# Patient Record
Sex: Female | Born: 1935 | Race: Black or African American | Hispanic: No | Smoking: Never smoker
Health system: Southern US, Community
[De-identification: ages and names within clinical notes are randomized; demographics above are authoritative.]

## PROBLEM LIST (undated history)

## (undated) DIAGNOSIS — I495 Sick sinus syndrome: Secondary | ICD-10-CM

## (undated) DIAGNOSIS — I739 Peripheral vascular disease, unspecified: Secondary | ICD-10-CM

## (undated) DIAGNOSIS — F0390 Unspecified dementia without behavioral disturbance: Secondary | ICD-10-CM

## (undated) DIAGNOSIS — N189 Chronic kidney disease, unspecified: Secondary | ICD-10-CM

## (undated) DIAGNOSIS — I10 Essential (primary) hypertension: Secondary | ICD-10-CM

## (undated) DIAGNOSIS — D649 Anemia, unspecified: Secondary | ICD-10-CM

## (undated) HISTORY — DX: Sick sinus syndrome: I49.5

## (undated) HISTORY — DX: Essential (primary) hypertension: I10

---

## 1991-07-31 HISTORY — PX: PACEMAKER INSERTION: SHX728

## 1998-02-07 ENCOUNTER — Encounter: Admission: RE | Admit: 1998-02-07 | Discharge: 1998-02-07 | Payer: Self-pay | Admitting: Internal Medicine

## 1998-02-09 ENCOUNTER — Encounter: Admission: RE | Admit: 1998-02-09 | Discharge: 1998-02-09 | Payer: Self-pay | Admitting: Hematology and Oncology

## 1998-02-09 ENCOUNTER — Ambulatory Visit (HOSPITAL_COMMUNITY): Admission: RE | Admit: 1998-02-09 | Discharge: 1998-02-09 | Payer: Self-pay | Admitting: Internal Medicine

## 1998-08-29 ENCOUNTER — Encounter: Admission: RE | Admit: 1998-08-29 | Discharge: 1998-08-29 | Payer: Self-pay | Admitting: Internal Medicine

## 1998-11-03 ENCOUNTER — Emergency Department (HOSPITAL_COMMUNITY): Admission: EM | Admit: 1998-11-03 | Discharge: 1998-11-03 | Payer: Self-pay | Admitting: Emergency Medicine

## 1999-03-22 ENCOUNTER — Encounter: Admission: RE | Admit: 1999-03-22 | Discharge: 1999-03-22 | Payer: Self-pay | Admitting: Internal Medicine

## 1999-06-16 ENCOUNTER — Encounter: Admission: RE | Admit: 1999-06-16 | Discharge: 1999-06-16 | Payer: Self-pay | Admitting: Internal Medicine

## 1999-08-23 ENCOUNTER — Encounter: Admission: RE | Admit: 1999-08-23 | Discharge: 1999-08-23 | Payer: Self-pay | Admitting: Internal Medicine

## 2002-08-26 ENCOUNTER — Inpatient Hospital Stay (HOSPITAL_COMMUNITY): Admission: EM | Admit: 2002-08-26 | Discharge: 2002-08-29 | Payer: Self-pay | Admitting: Emergency Medicine

## 2002-08-26 ENCOUNTER — Encounter: Payer: Self-pay | Admitting: Emergency Medicine

## 2003-04-13 ENCOUNTER — Ambulatory Visit (HOSPITAL_COMMUNITY): Admission: RE | Admit: 2003-04-13 | Discharge: 2003-04-13 | Payer: Self-pay | Admitting: Internal Medicine

## 2003-05-16 ENCOUNTER — Inpatient Hospital Stay (HOSPITAL_COMMUNITY): Admission: EM | Admit: 2003-05-16 | Discharge: 2003-05-21 | Payer: Self-pay | Admitting: Emergency Medicine

## 2003-05-17 ENCOUNTER — Encounter: Payer: Self-pay | Admitting: Internal Medicine

## 2003-06-11 ENCOUNTER — Inpatient Hospital Stay (HOSPITAL_COMMUNITY): Admission: AD | Admit: 2003-06-11 | Discharge: 2003-06-15 | Payer: Self-pay | Admitting: Internal Medicine

## 2003-06-15 ENCOUNTER — Encounter (INDEPENDENT_AMBULATORY_CARE_PROVIDER_SITE_OTHER): Payer: Self-pay | Admitting: *Deleted

## 2003-08-01 ENCOUNTER — Emergency Department (HOSPITAL_COMMUNITY): Admission: EM | Admit: 2003-08-01 | Discharge: 2003-08-02 | Payer: Self-pay | Admitting: Emergency Medicine

## 2003-08-05 ENCOUNTER — Inpatient Hospital Stay (HOSPITAL_COMMUNITY): Admission: EM | Admit: 2003-08-05 | Discharge: 2003-08-18 | Payer: Self-pay | Admitting: Emergency Medicine

## 2003-08-12 ENCOUNTER — Encounter (INDEPENDENT_AMBULATORY_CARE_PROVIDER_SITE_OTHER): Payer: Self-pay | Admitting: *Deleted

## 2003-08-13 ENCOUNTER — Encounter (INDEPENDENT_AMBULATORY_CARE_PROVIDER_SITE_OTHER): Payer: Self-pay | Admitting: *Deleted

## 2003-08-17 ENCOUNTER — Encounter (INDEPENDENT_AMBULATORY_CARE_PROVIDER_SITE_OTHER): Payer: Self-pay | Admitting: *Deleted

## 2003-08-18 ENCOUNTER — Encounter (INDEPENDENT_AMBULATORY_CARE_PROVIDER_SITE_OTHER): Payer: Self-pay | Admitting: *Deleted

## 2004-02-08 ENCOUNTER — Ambulatory Visit (HOSPITAL_COMMUNITY): Admission: RE | Admit: 2004-02-08 | Discharge: 2004-02-09 | Payer: Self-pay | Admitting: Cardiology

## 2004-11-03 ENCOUNTER — Ambulatory Visit (HOSPITAL_COMMUNITY): Admission: RE | Admit: 2004-11-03 | Discharge: 2004-11-03 | Payer: Self-pay | Admitting: Internal Medicine

## 2005-06-29 ENCOUNTER — Ambulatory Visit (HOSPITAL_COMMUNITY): Admission: RE | Admit: 2005-06-29 | Discharge: 2005-06-29 | Payer: Self-pay | Admitting: Internal Medicine

## 2006-12-13 ENCOUNTER — Ambulatory Visit (HOSPITAL_COMMUNITY): Admission: RE | Admit: 2006-12-13 | Discharge: 2006-12-13 | Payer: Self-pay | Admitting: *Deleted

## 2007-05-23 ENCOUNTER — Encounter (INDEPENDENT_AMBULATORY_CARE_PROVIDER_SITE_OTHER): Payer: Self-pay | Admitting: Otolaryngology

## 2007-05-23 ENCOUNTER — Ambulatory Visit (HOSPITAL_COMMUNITY): Admission: RE | Admit: 2007-05-23 | Discharge: 2007-05-24 | Payer: Self-pay | Admitting: Otolaryngology

## 2009-05-26 ENCOUNTER — Inpatient Hospital Stay (HOSPITAL_COMMUNITY): Admission: EM | Admit: 2009-05-26 | Discharge: 2009-06-09 | Payer: Self-pay | Admitting: Emergency Medicine

## 2009-05-26 ENCOUNTER — Ambulatory Visit: Payer: Self-pay | Admitting: Internal Medicine

## 2009-05-26 ENCOUNTER — Encounter (INDEPENDENT_AMBULATORY_CARE_PROVIDER_SITE_OTHER): Payer: Self-pay | Admitting: *Deleted

## 2009-05-30 ENCOUNTER — Encounter (INDEPENDENT_AMBULATORY_CARE_PROVIDER_SITE_OTHER): Payer: Self-pay | Admitting: *Deleted

## 2009-06-02 ENCOUNTER — Ambulatory Visit: Payer: Self-pay | Admitting: Internal Medicine

## 2009-06-03 ENCOUNTER — Encounter (INDEPENDENT_AMBULATORY_CARE_PROVIDER_SITE_OTHER): Payer: Self-pay | Admitting: *Deleted

## 2009-06-03 ENCOUNTER — Ambulatory Visit: Payer: Self-pay | Admitting: Oncology

## 2009-06-09 ENCOUNTER — Encounter (INDEPENDENT_AMBULATORY_CARE_PROVIDER_SITE_OTHER): Payer: Self-pay | Admitting: *Deleted

## 2009-07-07 ENCOUNTER — Telehealth: Payer: Self-pay | Admitting: Internal Medicine

## 2009-07-07 DIAGNOSIS — I10 Essential (primary) hypertension: Secondary | ICD-10-CM | POA: Insufficient documentation

## 2009-07-07 DIAGNOSIS — I2510 Atherosclerotic heart disease of native coronary artery without angina pectoris: Secondary | ICD-10-CM | POA: Insufficient documentation

## 2009-07-07 DIAGNOSIS — K119 Disease of salivary gland, unspecified: Secondary | ICD-10-CM | POA: Insufficient documentation

## 2009-07-07 DIAGNOSIS — I739 Peripheral vascular disease, unspecified: Secondary | ICD-10-CM | POA: Insufficient documentation

## 2009-07-07 DIAGNOSIS — Z87448 Personal history of other diseases of urinary system: Secondary | ICD-10-CM | POA: Insufficient documentation

## 2009-07-07 DIAGNOSIS — R1310 Dysphagia, unspecified: Secondary | ICD-10-CM | POA: Insufficient documentation

## 2009-07-07 DIAGNOSIS — I6529 Occlusion and stenosis of unspecified carotid artery: Secondary | ICD-10-CM | POA: Insufficient documentation

## 2009-07-07 DIAGNOSIS — D539 Nutritional anemia, unspecified: Secondary | ICD-10-CM | POA: Insufficient documentation

## 2009-07-07 DIAGNOSIS — E785 Hyperlipidemia, unspecified: Secondary | ICD-10-CM | POA: Insufficient documentation

## 2009-07-07 DIAGNOSIS — F068 Other specified mental disorders due to known physiological condition: Secondary | ICD-10-CM

## 2009-07-07 DIAGNOSIS — A419 Sepsis, unspecified organism: Secondary | ICD-10-CM | POA: Insufficient documentation

## 2009-07-07 DIAGNOSIS — R6521 Severe sepsis with septic shock: Secondary | ICD-10-CM

## 2009-07-07 DIAGNOSIS — D696 Thrombocytopenia, unspecified: Secondary | ICD-10-CM | POA: Insufficient documentation

## 2009-07-13 ENCOUNTER — Ambulatory Visit: Payer: Self-pay | Admitting: Internal Medicine

## 2009-07-13 DIAGNOSIS — K5900 Constipation, unspecified: Secondary | ICD-10-CM | POA: Insufficient documentation

## 2010-05-21 ENCOUNTER — Encounter (HOSPITAL_BASED_OUTPATIENT_CLINIC_OR_DEPARTMENT_OTHER): Payer: Self-pay | Admitting: Internal Medicine

## 2010-05-30 NOTE — Consult Note (Signed)
Summary: Evaluation for Pacemaker       NAME:  [name], [name]                 ACCOUNT NO.:  [id number]      MEDICAL RECORD NO.:  [id number]          PATIENT TYPE:  INP      LOCATION:  1223                         FACILITY:  [facility]      PHYSICIAN:  Jake Bathe, MD      DATE OF BIRTH:  1915/11/19      DATE OF CONSULTATION:  05/27/2009   DATE OF DISCHARGE:                                    CONSULTATION      REASON FOR CONSULTATION:  At the request of Critical Care Medicine for   the evaluation of pacemaker.  Dr. Molli Knock referring.  Pacemaker was   placed originally by Dr. Corliss Marcus.      She is a 77 year old female who is currently critically ill with sepsis   physiology, on dopamine/Levophed, who yesterday was noted to have   inappropriate pacer spikes throughout her telemetry strips when she was   in a normal sinus rhythm.  The pacemaker did not appear to be sensing   correctly and was inappropriately trying to pace.  Of course, these   pacer spikes were not capturing secondary to the placement within the R-   R interval.  In other words, this was within the ventricular refractory.      Because of her [employer] device, I had Feliz Beam with [employer] interrogate   her pacemaker earlier this morning.  During that period of time, she was   tachycardia due to her beta-agonist administration.  He was able to tell   that her atrial sensing was quite low at 1.0 to 1.4 mV and her   ventricular sensing was 5.6 to 8.0.  Her mode was on DDDR.  There is   actually some question of an AVNRT but this is likely sinus tachycardia   currently.      Because of the atrial sensing issues, she was switched to a DDIR with   the I being inhibition with rate response.  She currently with her   tachycardia is not utilizing her pacemaker device.  She has had no   severe bradycardia.      She is currently critically ill.  I have spoken with her family member,   one of which is a Restaurant manager, fast food.  Please let me  know if I can be of any   further assistance.  Currently, she is being managed medically by the   critical care team.               Jake Bathe, MD            MCS/MEDQ  D:  05/28/2009  T:  05/28/2009  Job:  [id number]         Electronically Signed by Donato Schultz MD on 06/20/2009 04:26:50 PM

## 2010-05-30 NOTE — Discharge Summary (Signed)
Summary: Altered Level of Consciousness   NAME:  [name], [name]                           ACCOUNT NO.:  [id number]   MEDICAL RECORD NO.:  [id number]                   PATIENT TYPE:  INP   LOCATION:  4743                                 FACILITY:  [facility]   PHYSICIAN:  Rosanne Sack, M.D.         DATE OF BIRTH:  [birth year]   DATE OF ADMISSION:  06/11/2003  DATE OF DISCHARGE:  06/15/2003                                 DISCHARGE SUMMARY   CHIEF COMPLAINT:  Altered level of consciousness, sacral ulcer.   DISCHARGE DIAGNOSES:  1. Sacral decubitus ulcer, no obvious indication of active infection.     a. Leukocytosis improved with current antibiotic therapy, day five of 14.        Most likely source leukocytosis, chronic left heel osteomyelitis as        per January 2005 x-ray indicating osteomyelitis with possible        superimposed calcaneal fracture.     b. Wound care team consult regarding unstaged sacral ulcer with an 80%        necrotic slough with no odor or exudate. Unstagable currently.        Recommendation Accuzyme enzymatic debridement with Bailey gauze        overlay dressing change. Facility wound care at the nursing home        should follow this site daily and turn the patient right to left. We        also encourage utilization of pressure mattress overlay.  2. Failure to thrive with hypoalbuminemia, likely multifactorial secondary     to dementia, chronic osteomyelitis, multiple other comorbidities.     a. Nutritionalist consult obtained over the course of the        hospitalization. Recommended continuation of current diet (__________)        with increased calorie presentation per dining trays and Ensure        pudding supplement three times daily between meals and at bedtime.     b. Will need close supervision and documentation of oral intake, food and        fluids. Would follow with at least once weekly weights to monitor for        failure to thrive. would  follow-up a serum prealbumin two to three        weeks post discharge. I have encouraged the family to participate with        meal intake to maximize the patient's nutrition. Would certainly        continue to encourage their participation post discharge in the        nursing home.     c. I have added Marinol twice daily as an appetite stimulant at the time        of discharge. Megace was avoided due to history of likely TIAs and        peripheral vascular disease. Would titrate  dosage as per affect post        discharge.  3. Altered mental status thought secondary to baseline dementia and     underlying osteomyelitis.     a. History of recurrent unresponsiveness, etiology unclear, question TIA        secondary to hypertension versus unknown etiology.     b. CT scan brain May 18, 2002 noted atrophy and small vessel disease        with no acute findings. MRI/MRA not possible due to previous pacemaker        implant.     c. EEG study May 17, 2003 noted mild diffuse background slowing. This        may be primarily degenerative in nature secondary to dementia or a        variety of toxic or metabolic etiologies.     d. Carotid Doppler study May 17, 2003 noted right side 60-80%        internal carotid stenosis and left side no indication of carotid        artery stenosis. The vertebral artery flow antegrade bilaterally. This        is not thought contributory to the patient's intermittent        unresponsiveness.  4. Telemetry monitoring over the course or hospitalization, no indication of     arrhythmias.     a. Continue aspirin and Plavix prophylaxis post hospitalization.  5. Normocytic anemia, likely chronic disease with component of iron     deficiency anemia.     a. The patient was transfused one unit of packed red blood cells over the        course of hospitalization. Hemoglobin climbing from admission 8.9 to        post transfusion 10.2. At discharge hemoglobin is stable  at 10.3.  6. Hypertension.  Stable on current medications.     a. Previously questioned the presence of possible renal artery stenosis.  7. Chronic renal insufficiency with baseline BUN and creatinine of     approximately 23 and 1.4.     a. Renal function stable over the course of hospitalization status post        IV hydration BUN 8 and creatinine 1.1.     b. Would follow a basic metabolic panel one to two weeks post discharge.        Would follow hemoglobin and hematocrit one to two weeks post        discharge.  8. Bilateral heel dry gangrene right greater than left.     a. Current hospitalization wound care team consult found no indication of        dressings to these sites. Did request floating the heels off of the        bed to avoid pressure.     b. Lower extremity Doppler study May 16, 2002 noted bilateral ABI        demonstrating severe reduction in flow left side worse than right.     c. Left foot and heel x-ray May 17, 2003 notes osteomyelitis with a        possible superimposed calcaneal fracture.  9. CVTS consult January 2005, Dr. Edilia Bo, felt the patient was not a     candidate for revascularization. Felt there was no current need for     amputation, but if gangrene became wet type or the site became infected     would consider amputation.  10.  Suspected chronic osteomyelitis at the left heel site.  11.      History of coronary artery disease.     a. Status post pacemaker implant in the 1990s.     b. History of a myocardial infarction 1982.  12.      History of hyperlipidemia.  13.      History of non-cardiac chest pain thought secondary to     costochondritis April 2004.  14.      History of dyspnea with exertion.  15.      History of dehydration and hypokalemia January 2005.  16.      History of dementia with associated agitative and aggressive     behavior.  17.      No known drug allergies.  18.      Code status is full code per family request. 19.       Lovenox DVT prophylaxis over the course of this hospitalization.   DISCHARGE MEDICATIONS:  1. Trinsicon 1 tablet twice daily.  2. Geodon 80 mg twice daily.  3. Lopressor 50 mg twice daily.  4. Senokot two tablets daily at bedtime.  5. Vitamin C 500 mg twice daily.  6. Zinc sulfate 220 mg daily.  7. Vasotec 10 mg daily.  8. Clonidine 0.1 mg twice daily. Hold for systolic blood pressure less than     100.  9. Marinol 2.5 mg twice daily.  10.      Aspirin 325 mg daily, enteric-coated.  11.      Multivitamin once daily.  12.      Colace 100 mg twice daily.  13.      Plavix 75 mg once daily.  14.      Protonix 40 mg once daily.  15.      Augmentin 875 mg twice daily for an additional nine days post     discharge.  16.      Ensure pudding three times daily between meals and at bedtime.  17.      Tylenol 650 mg every four hours as needed for mild pain or fever.  18.      Vicodin 5/500 mg one tablet every four hours as needed for moderate     pain.   DISCHARGE INSTRUCTIONS:  1. Returning to [facility], primary care Dr. Baltazar Najjar.  2. Activity:  Up to chair as tolerated with full assistance.  3. Diet:  Unrestricted due to current failure to thrive.  4. Heel should be floated off the mattress to avoid pressure.  5. Repeat hemoglobin, hematocrit, and basic metabolic panel one to two weeks     post discharge.  6. Routine pulse and blood pressure checks post discharge.  7. Would closely monitor oral intake, food, and fluids. Would follow weekly     weights to monitor for failure to thrive.  8. Wound care to sacral ulcer should be Accuzyme to debride with gauze     dressing once daily. Once site is debrided may discontinue Accuzyme and     proceed with wound care per facility protocol. The patient should be     turned to left or right side every two hours. Suggest an air mattress     overlay if available to reduce pressure. I have continued Foley catheter      at the time of discharge to aid wound care healing of sacral ulcer.     Continuation of this catheter per the discretion of  the primary care     physician.   CONSULTATIONS OVER THE COURSE OF HOSPITALIZATION:  1. Wound care team.  2. Nutritionalist consult.   PROCEDURES PERFORMED:  Transfusion one unit packed red blood cells.   DISCHARGE LABS:  Metabolic panel June 15, 2003:  Sodium 141, potassium  3.9, chloride 108, CO2 27, BUN 8, creatinine 1.1. CBC June 15, 2003:  WBC 10.5, hemoglobin 10.3, hematocrit 31.2, platelets 314. A swab culture of  sacral wound produced multiple organisms, likely contaminants. Serum TSH  0.655. Blood cultures produced no growth over the course of the  hospitalization. Urine culture produced no growth over the course of the  hospitalization. Chest x-ray June 01, 2003 with stable chest with no  active disease.   HISTORY OF PRESENT ILLNESS:  An 77 year old female resident of [facility], followed there in primary care by Dr. Baltazar Najjar.  The patient has had progressive dry gangrene to the heels bilaterally,  previously evaluated by Dr. Edilia Bo, CVTS. She was found to have a sacral  ulcer by the nursing staff at the [facility]. This was  evaluated by the facility wound care nurse and she thought there was some  central necrosis and some periwound erythema. They described the patient  febrile, but she was not found so with her transport to the emergency room.  She was noted to have leukocytosis of 12.8 per nursing home labs, also a  hemoglobin of 8.9. The patient was admitted for further evaluation and  treatment. For dictated admission medications, family history, social  history, review of systems, physical exam, laboratory data, impression and  plan, please see the dictated admission history and physical daily June 01, 2003.   HOSPITAL COURSE:  1. Sacral decubitus ulcer. The patient presented with a  sacral ulcer as     above. She had no fever since her presentation in the emergency room, but     did have noted leukocytosis as above. Was empirically started on IV     antibiotic therapy with Zosyn. A wound team consult was obtained over the     course of the hospitalization. They noted no indication of exudate or     periwound changes. They recommended Accuzyme to the site to debride this     currently unstagable wound. Also pressure relief with turning left to     right. I would also utilize a pressure mattress overlay if available. The     patient should be turned left to right every two hours. Following     debridement as described above would proceed with wound care treatment     per facility protocol. It is thought that the patient's most likely     explanation for her leukocytosis is chronic left heel osteomyelitis as     noted per x-ray on May 17, 2003. With antibiotic therapy her     leukocytosis has decreased to 10.5. She is on day five of 14 antibiotic     therapy switched from Zosyn to oral Augmentin. At some point the patient     may require an amputation per previous CVTS consult, Dr. Edilia Bo, but     only if this should change to wet gangrene or demonstrate signs of active     infection. Per the wound care consult the heel sites do not currently     require a dressing, but do require floating the heels off of the bed     surface to avoid pressure. All  of her wounds will require frequent     facility monitoring at the nursing home.  2. Failure to thrive and hypoalbuminemia. This thought multifactorial due to     her baseline dementia, chronic osteomyelitis, multiple other     comorbidities. Her oral intake was quite poor at admission, but has     improved over the past 24 hours. Her albumin was low at 2.3. She did have     a nutritionalist consult recommending increased kilocalories to the meal     trays. I have discontinued any dietary restrictions such as low-fat,  low-    cholesterol, low-salt in hopes to encourage oral intake. I believe the     failure to thrive and development of sacral ulcer are more concerning     than her previous history of coronary artery disease. I added Marinol as     an appetite stimulant and would follow oral intake, food, and fluids     closely post discharge. Would titrate the dose based on the effect. Would     supplement as above. I have also discussed with the family the importance     of her improved oral intake. They participated with her meal intake over     the course of the hospitalization and I would certainly encourage their     further participation post discharge at the nursing home.  3. Altered mental status/dementia. The patient was noted lethargic on     presentation. This is likely multifactorial due to her chronic     osteomyelitis, failure to thrive, baseline dementia, and multiple     comorbidities. This was not aggressively worked up over the course of     this hospitalization and her level of consciousness quickly returned to     baseline. She did have a full workup for recurrent periods of     unresponsiveness during hospitalization May 17, 2003 through May 21, 2003 as above.  4. Normocytic anemia. The patient does have a normocytic anemia, likely a     combination chronic disease and component iron deficiency anemia. She is     on routine iron supplementation. Despite this, her hemoglobin decreased     from baseline mid 10 range to 3.9 on admission. She was transfused on     unit of packed red blood cells. Post transfusion hemoglobin climbed to     10.2 and this remained stable over the remainder of the hospitalization,     at discharge 10.3. Would follow with a repeat hemoglobin and hematocrit     one to two weeks post discharge.  5. Hypertension.  This is currently uncontrolled during the hospitalization     May 17, 2003 through May 21, 2003. Currently her blood pressures      are well-controlled on her antihypertensive medication regime. Would     follow blood pressures and pulse routinely post discharge at the nursing     home.  6. Chronic renal insufficiency.  The patient has a known history of chronic     renal insufficiency. Baseline BUN 23 and creatinine 1.4. She was     initially hydrated with IV fluids and her renal function remained stable     over the course of the hospitalization. Discharge BUN 8 and creatinine     1.1. Will follow with a basic metabolic panel one to two weeks post     discharge. Will aggressively encourage oral intake of fluids.  7. Bilateral heel dry  gangrene, left greater than right. Suspected chronic     osteomyelitis left heel. Treatment and impressions as above under #1.  8. History of coronary artery disease status post pacemaker implant. The     patient has a known history of MI in 59. She had no complaints of chest     pain over the course of this hospitalization. She remained stable with a     paced rhythm per telemetry. 9. Code status. The patient is full code per family wishes, this despite her     multiple comorbidities.   DISPOSITION:  Discharged back to [facility], primary care  doctor, Dr. Baltazar Najjar. Activity up as tolerated to chair.   CONDITION ON DISCHARGE:  Stable/improved, though frail with multiple medical  problems as above.   Discharge process greater than 30 minutes 11 o'clock a.m. through 11:45 a.m.      Everett Graff, N.P.                     Rosanne Sack, M.D.    TC/MEDQ  D:  06/15/2003  Blackburn:  06/15/2003  Job:  [id number]

## 2010-05-30 NOTE — Consult Note (Signed)
Summary: Feeding Tube  NAME:  MISSY, BAKSH                           ACCOUNT NO.:  [id number]   MEDICAL RECORD NO.:  [id number]                   PATIENT TYPE:  INP   LOCATION:  5154                                 FACILITY:  [facility]   PHYSICIAN:  Georgiana Spinner, M.D.                 DATE OF BIRTH:  17-Sep-1915   DATE OF CONSULTATION:  08/12/2003  DATE OF DISCHARGE:                                   CONSULTATION   REASON FOR CONSULTATION:  Ms. Anton is an 77 year old lady who I have been  asked to see for a feeding tube.   HISTORY OF PRESENT ILLNESS:  She was admitted for urinary tract infection  and probable sepsis August 04, 2003.  She has had anorexia and failure to  thrive.  She had a necrotic sacral ulcer with gangrene on both heels,  chronic anemia, history of peripheral vascular disease, hypertension, and  mild renal insufficiency, dementia.  She has had a pacemaker with coronary  artery disease documented by a myocardial infarction in 1982.  She has no  known drug allergies.  She was a full code patient.   She has had a recent steady decline in overall health for the last two  months prior to admission.  She had hospitalization earlier this year for  sepsis, failure to thrive, and the patient had not been eating or drinking  in the nursing home.   MEDICATIONS:  On admission, she was on aspirin, Plavix, Prilosec, zinc,  Clonidine, and other medications as listed on the history and physical.   FAMILY HISTORY:  Not considered significant.   SOCIAL HISTORY:  Not considered significant.   REVIEW OF SYMPTOMS:  Unobtainable by me as well.   PHYSICAL EXAMINATION:  VITAL SIGNS:  Temperature is 97.9, pulse 94,  respiratory rate 18 with a 95% O2 saturation on room air.  Blood pressure  105/50.  HEENT:  Unremarkable.  NECK:  Thyroid not enlarged.  No bruits in the neck.  LUNGS:  Clear.  CARDIOVASCULAR:  Regular rhythm without murmurs, rubs, or gallops.  ABDOMEN:  Soft without  masses, tenderness, or organomegaly.  Her abdomen has  quite bit of a pannus.   IMPRESSION:  The patient has not been able to eat here.  She had a  swallowing study which showed no mechanical problems to swallowing but the  patient is just not initiating the feedings.  The family wants a feeding  tube placed because she does still interact with her family and this appears  to be fairly reasonable to Dr. Mayford Knife.  I have not met with the family or  seen the patient interact but believe their opinion.  We will plan on doing  a percutaneous endoscopic gastrostomy tube placement.  We will explain to  the family the procedure, risks and benefits, rationale, and alternatives.  Hopefully, they will be willing to proceed with this  lady.                                              Georgiana Spinner, M.D.   GMO/MEDQ  D:  08/12/2003  T:  08/12/2003  Job:  [id number]

## 2010-05-30 NOTE — Consult Note (Signed)
Summary: For [employer]  For [facility]   Imported By: Lester Ruth 07/28/2009 08:54:27  _____________________________________________________________________  External Attachment:    Type:   Image     Comment:   External Document

## 2010-05-30 NOTE — Discharge Summary (Signed)
Summary: Anemia       NAME:  [name], [name]                 ACCOUNT NO.:  [id number]      MEDICAL RECORD NO.:  [id number]          PATIENT TYPE:  INP      LOCATION:  1338                         FACILITY[facility]  [facility]      PHYSICIAN:  Altha Harm, MDDATE OF BIRTH:  04-17-1916      DATE OF ADMISSION:  05/26/2009   DATE OF DISCHARGE:  06/09/2009                                  DISCHARGE SUMMARY      ADDENDUM:      DISCHARGE DISPOSITION:  Skilled nursing facility.      PRIMARY CARE PHYSICIAN:  [facility].      HISTORY:  Please refer to the discharge summary that was dictated on   June 07, 2009 for details of the discharge course.  Please note, the   patient was scheduled to be discharged.  However, her discharge was held   pending bed at the skilled nursing facility and observation of her   hemoglobin.      In terms of her hospital course, the patient remained stable.  The   patient in terms of her anemia,  was noted to have a few black tarry   stools.  Her hemoglobin decreased to a nadir of 7.9 at the day of   admission.  She was transfused 2 units of packed red blood cells and   Gastroenterology was consulted.  Dr. Juanda Chance saw her in consult and felt   that there was no need for GI intervention at this time.  However, she   felt that the patient should be observed for an additional 24-48 hours   to ensure that she did not have a drop in her hemoglobin.  The patient's   hemoglobin stayed stable at 9.5 to 9.7.  The patient has had no further   clinical changes and will be discharged to the nursing home today.      DISCHARGE MEDICATIONS:  Please add iron sulfate 325 mg p.o. b.i.d.   otherwise, please refer to discharge summary for medication list.      LABORATORY DATA:  Pertinent laboratory studies on the day of admission:   The patient has a hemoglobin of 9.4, white blood cell count of 9.3,   hematocrit 27.4, platelet count of 103, sodium 140, potassium 4.0,   chloride 110, bicarb 24, BUN 15, creatinine 1.7, calcium 7.5, phosphorus   of 3.1, magnesium of 1.6.  Her serum iron was low at 11 and ferritin   levels were high at 635.      CLINICAL CONDITION ON DISCHARGE:  Stable.      FOLLOW UP:   1. The patient to follow up with her primary care physician within 72       hours of returning to the skilled facility.   2. Follow up as noted in the discharge summary from June 07, 2009.      Total time for this discharge 1 hour.            Altha Harm, MD  MAM/MEDQ  D:  06/09/2009  T:  06/09/2009  Job:  [id number]      cc:   [facility]         Electronically Signed by Marthann Schiller MD on 06/15/2009 12:42:24 PM

## 2010-05-30 NOTE — Progress Notes (Signed)
Summary: Need P.O.A present for Office Visit  Phone Note Outgoing Call   Call placed by: Hortense Ramal CMA Duncan Dull),  July 07, 2009 1:55 PM Call placed to: [facility] Summary of Call: I have spoken to [place] at patient's skilled nursing facility to advise her that the patient must have her power of attorney present with her for her office visit which has been scheduled on 07/13/09 for hospital follow up.  She verbalizes understanding and has taken my name and number should any questions arise. Initial call taken by: Hortense Ramal CMA Duncan Dull),  July 07, 2009 1:57 PM

## 2010-05-30 NOTE — Assessment & Plan Note (Signed)
Summary: POST HOSPITAL FOLLOW-UP                DEBORAH   History of Present Illness Visit Type: Initial Visit Primary GI MD: Lina Sar MD Primary Provider: Providence Crosby, MD Chief Complaint: [facility], patient was anemic with a few black tarry stools History of Present Illness:   This is a 77 year old African American female who is status post hospitalization for an acute GI bleed presenting with melena. Her hemoglobin dropper from it's usual 9 g to 7.9 g. Because of her age, she did not have a GI evaluation due to the high risk of sedation. She was transfused 2 units of packed cells. There is a history of coronary artery disease and MI in 1982. She has a permanent transvenous pacemaker. She had a gastrostomy placed in April 2005 but currently there is no gastrostomy present and she is eating well. Her last hemoglobin was 9.3 and her serum iron was 11. Her iron binding capacity was 168 consistent with anemia of chronic disease and her ferritin was 635.   GI Review of Systems      Denies abdominal pain, acid reflux, belching, bloating, chest pain, dysphagia with liquids, dysphagia with solids, heartburn, loss of appetite, nausea, vomiting, vomiting blood, weight loss, and  weight gain.        Denies anal fissure, black tarry stools, change in bowel habit, constipation, diarrhea, diverticulosis, fecal incontinence, heme positive stool, hemorrhoids, irritable bowel syndrome, jaundice, light color stool, liver problems, rectal bleeding, and  rectal pain.    Current Medications (verified): 1)  Acetaminophen 325 Mg Tabs (Acetaminophen) .... Take 2 Tablets By Mouth Every 4 Hours As Needed 2)  Hydralazine Hcl 10 Mg Tabs (Hydralazine Hcl) .... Take 1 Tablet By Mouth Three Times A Day 3)  Metoprolol Tartrate 50 Mg Tabs (Metoprolol Tartrate) .... Take 1 Tablet By Mouth Two Times A Day 4)  Aricept 10 Mg Tabs (Donepezil Hcl) .... Take 1 Tablet By Mouth Once A Day 5)  Aspirin 81 Mg Tbec  (Aspirin) .... Take 1 Tablet By Mouth Once A Day 6)  Centrum  Tabs (Multiple Vitamins-Minerals) .... Take 1 Tablet By Mouth Once A Day 7)  Depakote 125 Mg Tbec (Divalproex Sodium) .... Take 2 Capsules By Mouth Three Times A Day 8)  Lasix 20 Mg Tabs (Furosemide) .... Take 1 Tablet By Mouth Once A Day 9)  Morphine Sulfate 15 Mg Tabs (Morphine Sulfate) .... Take 1 Tablet By Mouth Every Morning 10)  Namenda 10 Mg Tabs (Memantine Hcl) .... Take 1 Tablet By Mouth Two Times A Day 11)  Norvasc 10 Mg Tabs (Amlodipine Besylate) .... Take 1 Tablet By Mouth Once A Day 12)  Prilosec Otc 20 Mg Tbec (Omeprazole Magnesium) .... Once Daily 13)  Senokot 8.6 Mg Tabs (Sennosides) .... Take 1 Tablet By Mouth At Bedtime 14)  Seroquel 25 Mg Tabs (Quetiapine Fumarate) .... Take 1 Tab By Mouth At Bedtime 15)  Sertraline Hcl 100 Mg Tabs (Sertraline Hcl) .... Take 1 Tablet By Mouth Once A Day 16)  Sertraline Hcl 50 Mg Tabs (Sertraline Hcl) .... Take 1 Tablet By Mouth Once A Day 17)  Zocor 20 Mg Tabs (Simvastatin) .... Take 1 Tab By Mouth At Bedtime 18)  Remeron 15 Mg Tabs (Mirtazapine) .... Take 1/2 Tablet By Mouth Once Daily  Allergies (verified): No Known Drug Allergies  Past History:  Past Medical History: Reviewed history from 07/07/2009 and no changes required. Current Problems:  DYSPHAGIA UNSPECIFIED (ICD-787.20) CAROTID  ARTERY DISEASE (ICD-433.10) PERIPHERAL VASCULAR DISEASE (ICD-443.9) DYSLIPIDEMIA (ICD-272.4) CORONARY ARTERY DISEASE (ICD-414.00) RENAL FAILURE, ACUTE, HX OF (ICD-V13.09) DEMENTIA (ICD-294.8) HYPERTENSION (ICD-401.9) THROMBOCYTOPENIA (ICD-287.5) PYELONEPHRITIS, HX OF (ICD-V13.00) SEPTIC SHOCK (ICD-785.52) ANEMIA, CHRONIC (ICD-281.9)    Past Surgical History: Reviewed history from 07/07/2009 and no changes required. PEG Placement Pacemaker Placement-single chamber...later upgraded to dual chamber Right superficial parotidectomy with dissection of the facial nerve  Right double-J  stent placement for hydronephrosis  Family History: Unknown  Review of Systems       The patient complains of confusion.  The patient denies allergy/sinus, anemia, anxiety-new, arthritis/joint pain, back pain, blood in urine, breast changes/lumps, change in vision, cough, coughing up blood, depression-new, fainting, fatigue, fever, headaches-new, hearing problems, heart murmur, heart rhythm changes, itching, menstrual pain, muscle pains/cramps, night sweats, nosebleeds, pregnancy symptoms, shortness of breath, skin rash, sleeping problems, sore throat, swelling of feet/legs, swollen lymph glands, thirst - excessive , urination - excessive , urination changes/pain, urine leakage, vision changes, and voice change.         Pertinent positive and negative review of systems were noted in the above HPI. All other ROS was otherwise negative.   Vital Signs:  Patient profile:   77 year old female Height:      65 inches Weight:      151 pounds Pulse rate:   80 / minute Pulse rhythm:   regular BP sitting:   130 / 58  (left arm) Cuff size:   regular  Vitals Entered By: June McMurray CMA Duncan Dull) (July 13, 2009 11:19 AM)  Physical Exam  General:  elderly female who is hard of hearing. She is in a wheelchair. We carried her from the wheel chair to the examining table. Eyes:  PERRLA, no icterus. Mouth:  No deformity or lesions, dentition normal. Neck:  Supple; no masses or thyromegaly. Chest Wall:  permanent transvenous pacemaker in anterior left chest. Lungs:  Clear throughout to auscultation. Heart:  Regular rate and rhythm; no murmurs, rubs,  or bruits. Abdomen:  soft abdomen with normoactive bowel sounds. Minimal tenderness right lower quadrant. No bruit. No ascites. No distention. Rectal:  large amount of hard Hemoccult-negative stool. Extremities:  bilateral trace edema. Skin:  Intact without significant lesions or rashes. Psych:  hard of hearing but alert and oriented.   Impression &  Recommendations:  Problem # 1:  ANEMIA, CHRONIC (ICD-281.9) Patient has chronic anemia with Hemoccult negative stool. There is no further GI evaluation necessary at this time. She is not a good candidate for conscious sedation unless she has an acute bleed.  Problem # 2:  CONSTIPATION (ICD-564.00) Patient has functional constipation due to immobility and other medications. She has been on Senokot 2 tablets daily. We will increase her laxative regimen to add MiraLax 9 g 3 times a week.  Patient Instructions: 1)  Please begin taking Miralax 9 grams dissolved in 8 ounces water every Monday, Wednesday and Friday. 2)  Follow up as needed 3)  continue Senokot 2 p.o. q.h.s. 4)  Continue Prilosec 20 mg daily. 5)  Copy sent to : V. Chowbey, MD 6)  The medication list was reviewed and reconciled.  All changed / newly prescribed medications were explained.  A complete medication list was provided to the patient / caregiver. Prescriptions: MIRALAX  POWD (POLYETHYLENE GLYCOL 3350) Take 9 grams (1/2 scoopful) dissolved in 8 ounces water every Monday, Wednesday, and Friday  #527 grams x 7   Entered by:   Hortense Ramal CMA (AAMA)   Authorized  by:   Hart Carwin MD   Signed by:   Hortense Ramal CMA (AAMA) on 07/13/2009   Method used:   Print then Give to Patient   RxID:   [id number]

## 2010-05-30 NOTE — Consult Note (Signed)
Summary: Right Flank Pain    NAME:  [name], BALTAZAR                 ACCOUNT NO.:  [id number]      MEDICAL RECORD NO.:  [id number]          PATIENT TYPE:  INP      LOCATION:  1338                         FACILITY:  [facility]      PHYSICIAN:  Firas N. Shadad        DATE OF BIRTH:  1915-10-17      DATE OF CONSULTATION:  06/03/2009   DATE OF DISCHARGE:                                    CONSULTATION      PRIMARY PHYSICIAN:  [employer].      REQUESTING PHYSICIAN:  [facility].      HISTORY OF PRESENT ILLNESS:  [name] is a 77 year old African   American female with multiple medical problems listed below, nursing   home resident due to dementia, who was admitted on May 26, 2009,   with increased altered mental status from the baseline, and a 2-3 day   history of right flank pain, frank pyuria.  A CT of the abdomen and   pelvis was performed, showing right hydronephrosis, right hydroureter,   and a diagnosis of severe urosepsis.  Platelets on admission were   152,000, and prior labs demonstrate platelets of normal value.  On   admission, she was placed on IV antibiotics, consisting of the Cefepime   IV, with vancomycin added on May 27, 2009, along with IV fluids at   150 mL an hour.  She also received  hydrocortisone from January 28   through February 1 for acute respiratory failure.  In the interim, her   platelets began to trend down, from 152,000 on January 27, to 107,000 on   January 28, 101,000 on the following day, 74,000 on January 30 as, and   eventually 52,000, and today at 42,000 despite no heparin or low   molecular weight heparin used.  DIC panel was sent on May 27, 2009,   but those results are not yet available.  The smear shows a large   platelets, toxic granulation.  No fragments or schistocytes.  Current   antibiotics consist of Rocephin since Maxipime and Cipro as well as   vancomycin were discontinued.  No evidence of bleeding.  We were asked   to see the patient regarding these abnormal platelet counts, and to rule   out thrombocytopenia etiology.      PAST MEDICAL HISTORY:   1. Dementia.   2. History of sick sinus syndrome status post pacemaker placement.   3. Anemia of chronic disease.   4. Hypertension.   5. Dyslipidemia.   6. Peripheral vascular disease.   7. Carotid artery disease.   8. Renal insufficiency with baseline creatinine between 1.5 and 1.7.   9. Remote alcohol abuse in the 1960s and 1970s.   10.Recurrent UTIs.   11.Status post MI in 1982.      During this admission the patient has:   1. Pyelonephritis, with Proteus mirabilis culture positive, status       post right ureteral stent.   2. Septic shock.  3. Respiratory failure.   4. Dysphagia, which required GI evaluation, swallow evaluation failed       x2, possible PEG tube insertion is entertained when the       thrombocytopenia improves.      SURGERY:   1. Status post pacemaker placement, with upgrade in 2005.   2. Status post right parotid  gland mass extraction in January 2009,       Dr. Jenne Pane, negative.   3. Status post PEG tube in April 2005, with eventual extraction.      ALLERGIES:  NKDA.      MEDICATIONS:   1. Rocephin 1 gram IV q.24 hours.   2. IV fluids 50 mL an hour of normal saline.   3. Zofran 4 mg IV q.6 hours p.r.n.   4. TPN.   5. Protonix 40 mg IV.      The current medications are on hold today including Aricept, Namenda,   multivitamins, Seroquel, Senokot, Zoloft and Phenergan.      REVIEW OF SYSTEMS:  Cannot be obtained due to the mental status changes.      FAMILY HISTORY:  Noncontributory.      SOCIAL HISTORY:  The patient is widowed.  She has 9 children.  Full   code.  She quit in 1970s the use of tobacco and alcohol.  [employer].      PHYSICAL EXAMINATION:  This is a frail 77 year old African American   female, ill-appearing, lethargic.   Blood pressure 150/90, pulse 80, respirations 20, the patient is   afebrile.    HEENT:  Poor dentition.  No gum bleeding.  NG tube.  Normocephalic,   atraumatic.  PERLA.  Sclerae anicteric.  LUNGS:  Remarkable for   decreased breath sounds bilaterally but no wheezing, rhonchi or rales.   No axillary masses.  CARDIOVASCULAR:  Regular rate and rhythm with a 2/6   systolic murmur.  No rubs or gallops.   ABDOMEN:  Soft, nontender.  Bowel sounds x4.  No hepatosplenomegaly.   GU and RECTAL:  Deferred.   SKIN:  Without petechial rash or significant bruising.   BREASTS:  Not examined.   NEURO:  The patient has dementia.  Cannot follow commands, but is able   to move all her extremities.      LABORATORY DATA:  Hemoglobin 10.6, hematocrit 31.9, white count 32.4   (the patient was on hydrocortisone, discontinued on June 02, 2009),   platelet count 42,000. MCV 90.5, ANC 29.5, lymphocytes 1.6, monocytes   1.0.  Sodium 148, potassium 3.0, BUN 53, creatinine 1.79, glucose 126,   total bilirubin 0.7, alkaline phosphatase 31, AST 164, ALT 75, total   protein 5.3, albumin 1.9, calcium 7.8.  A HIT panel is pending.  MRSA   was positive PCR.  Troponin on May 27, 2009 was 2.47.      ASSESSMENT:  Dr. Clelia Croft has seen and evaluated the patient.  This is a   77 year old woman with thrombocytopenia in the setting of sepsis, acute   infection.  The differential diagnoses include:   1. Acute infarction related.   2. Antibiotics, including cefepime, which has been discontinued at       this time.   3. Heparin-induced thrombocytopenia (HIT) is less likely.   4. Disseminated intravascular coagulation (DIC) is less likely.      RECOMMENDATIONS:  Check HIT panel results.  Continue the supportive   measures.      The patient is at low risk of bleeding at this  time.  Will transfuse   platelets only if she has active bleeding or the platelet count drops to   less than or equal to 10,000.  Will follow back on Monday June 06, 2009.      Thank you very much for allowing [place] the  opportunity to participate in   the care of [name].               Marlowe Kays, P.A.         ______________________________   Blenda Nicely. ZOXWRU         SW/MEDQ  D:  06/06/2009  T:  06/06/2009  Job:  [id number]      Electronically Signed by Marlowe Kays P.A. on 06/06/2009 11:55:48 AM   Electronically Signed by Eli Hose  on 06/23/2009 09:20:31 AM   Electronically Signed by Eli Hose  on 06/23/2009 09:20:31 AM

## 2010-05-30 NOTE — Discharge Summary (Signed)
Summary: Malnutrition   NAME:  [name], GRANDERSON                           ACCOUNT NO.:  [id number]   MEDICAL RECORD NO.:  [id number]                   PATIENT TYPE:  INP   LOCATION:  5154                                 FACILITY:  [facility]   PHYSICIAN:  Lonia Blood, M.D.            DATE OF BIRTH:  [birth year]   DATE OF ADMISSION:  08/04/2003  DATE OF DISCHARGE:  08/18/2003                           DISCHARGE SUMMARY - REFERRING   DISCHARGE DIAGNOSES:  1. Pyelonephritis -     a. Infection at initial hospitalization by Citrobacter.     b. Infection at discharge from hospital with Proteus.  2. Severe protein calorie malnutrition with failure to thrive -     a. Percutaneous endoscopic gastroscopy tube initiated.     b. Cutaneous breakdown as noted below.  3. Dysphagia -     a. Severe oral phase dysphagia, thought to be secondary to dementia.     b. Percutaneous endoscopic gastroscopy tube placement per the family's        and patient's request.  4. Necrotic sacral and bilateral heel ulcers -     a. Severe protein calorie malnutrition.     b. Severe dementia.     c. Bedridden state.  5. Chronic anemia with discharge hemoglobin of 8.5.  6. Peripheral vascular disease.  7. Hypertension.  8. Chronic renal insufficiency with baseline creatinine of 1.5.  9. Dementia.  10.      Pacemaker placement.  11.      Coronary artery disease.  12.      Status post myocardial infarction in 1982.  13.      Urinary incontinence, requiring indwelling Foley catheter -     a. Foley catheter discontinued because of severe irritation of the vulva.     b. Probable overlying fungal vulvar involvement.   DISCHARGE MEDICATIONS:  1. Multivitamin, one p.o. daily.  2. Senokot two tab p.o. q.h.s.  3. Plavix 75 mg p.o. daily.  4. Protonix 40 mg p.o. daily.  5. Catapres 0.1 mg p.o. b.i.d.  6. Aspirin 81 mg p.o. daily.  7. Zoloft 25 mg p.o. daily.  8. Diflucan 100 mg p.o. daily x4 days, then stop.  9. Free  water via the PEG tube, 200 mL p.o. t.i.d.  10.      Jevity 1.2 calorie tube feed formulation at 60 mL per hour.  11.      Tylenol 650 mg via PEG tube or p.o. q.4-6h. p.r.n.   CONSULTATIONS:  1. Dr. Georgiana Spinner, gastroenterology.  2. Urinary incontinence nursing specialty team.   DIET:  The patient was able to tolerate a full liquid diet without high  concern for aspiration.  This is to be administered as needed for comfort,  in addition to the PEG tube feeds.   WOUND CARE:  Her sacral wound to be covered with a Calci-care/calcium  alginate dressing.  This is to be  covered with a 4 x 4.  This 4 x 4 is then  to be covered with OpSite to help prevent penetration of urine.  This is to  be changed daily.  The heels should be floated at all times, to prevent  further breakdown.   PROCEDURE:  1. PEG tube placement per Dr. Virginia Rochester on August 13, 2003.  2. CT scan of the abdomen and pelvis on August 17, 2003:  Normal, with no     evidence of leaking or abscess formation about the PEG tube insertion     site.  3. Speech pathology evaluation - cleared for a full liquid diet on August 11, 2003.   HISTORY OF PRESENT ILLNESS:  The patient is an 77 year old female with  multiple chronic medical problems as noted above, when she was residing in a  local nursing home.  In the days prior to her admission, she began to  develop a decreased level of consciousness.  This was further complicating  her chronic problems of severe malnutrition and severe necrotic sacral  ulceration.  A discussion was had with the family concerning the patient's  altered mental status.  They wished for her to undergo an evaluation at  [facility]. [facility], and to further address the possible need  for a PEG tube placement during her hospitalization.   HOSPITAL COURSE:  The patient was admitted initially with the diagnosis of  an altered mental status.  A workup including a urinalysis revealed a  Citrobacter  pyelonephritis.  The patient's cultures proved that this  bacteria was susceptible to ciprofloxacin.  She completed the full course of  ciprofloxacin.  She tolerated this well.  Her mental status improved back to  her baseline.  It should be noted that prior to her discharge, she began to  develop mildly elevated fever.  A workup included a repeat urinalysis which  revealed another pyelonephritis.  This time the culture was significant for  Proteus, with susceptibilities pending at the time of discharge.  The  patient had clinically improved significantly with the ciprofloxacin therapy  on an empiric basis.  The patient previously had a chronic indwelling Foley  catheter.  She is likely to be prone to ongoing and recurrent urinary tract  infections.  The Foley had to be discontinued during this hospitalization  because of severe irritation to the meatus, as well as the vulva.  There was  also evidence of overlying fungal infection.  The patient was treated for  this with Diflucan, but because of severe and extreme irritation, the Foley  had to be removed.  The patient's family requested that she be allowed to  wear diapers.  Counseling was carried out by the wound skin care nurse as  well as by this M.D., to explain to the patient's family that diapers could  exacerbate the patient's necrotic ulcers by holding urine and moisture  against them.  They voiced understanding, but did desire to continue to use  diapers.  At the time of discharge there is no evidence of active  pyelonephritis.  She is discharged on ciprofloxin 500 mg via PEG tube b.i.d.  x10 days, and stop.  Another significant issue during this hospitalization was that of nutrition.  Because of significant dementia, and because of multiple factors, to include  advanced age and recurrent urinary tract infections with altered mental  status, the patient has been losing weight.  Her p.o. intake has been very minimal.  There  are  significant concerns of dysphagia.  Speech pathology was  consulted during this hospitalization.  They evaluated the patient  initially, but their first evaluation was not deemed to be sufficient  because of the altered mental status.  When the patient returned to her  baseline mental status, they did provide further evaluation.  This included  a modified barium swallow which was carried out on August 09, 2003.  This  actually proved that there was no evidence of pharyngeal or lower  dysphagia/swallowing dysfunction.  It was appreciated that there was  significant oral phase dysphagia; however, the patient failed to initiate  swallowing.  This was felt to be secondary to the patient's severe dementia,  and not an actual functional swallowing issue.  Because of this, it was  ultimately felt that the patient would be very unreliable in regards to her  p.o. intake.  A discussion was held with the family as to the different  options.  They ultimately did desire with a PEG tube placement.  Dr. Virginia Rochester was  consulted and evaluated the patient.  She underwent a PEG tube placement  successfully, as noted above.  She had no complications from this.  In the  days after her PEG tube placement, she was complaining of some abdominal  pain.  Hemoglobin was dropping somewhat, and it was not clear whether this  was due to dilution or an actual blood loss.  As a result, a CT scan of the  abdomen was obtained.  There is no evidence of hematoma formation or  abscess.  Hemoglobin stabilized at around 8.5.  It was felt that her  hemoglobin drop was more reflective of volume expansion and dilutional  effect with increase of free water flushes to her PEG tube.  The PEG tube  inspection was clean without immediate signs of complications.  The patient  was tolerating her tube feeds at the recommended rate from out nutritional  consultation.  The patient was cleared for discharge.  The patient was admitted to the  hospital with known severe peripheral  vascular disease and malnutrition.  Inherent to these problems were chronic  ulcers of the sacrum as well as both heels.  A wound care specialty nurse  was consulted during this hospitalization.  The recommendations were those  made this specialty nurse.  These were followed during this hospitalization.  There was no significant improvement, but certainly no further decline in  the wounds during this patient's hospitalization.  It is not anticipated  that  the patient's wounds are likely to heal, because of her advanced age  and multiple complicating factors, to include severe protein calorie  malnutrition, urinary incontinence and bedridden status.  It was previously known prior to this admission that this patient suffered  with a 1.0 cm lesion appreciable on CT scan.  This was re-evaluated at the  time of her presentation to the emergency room.  This was stable in size in  comparison to her previous CT scan in December 2004.  These findings were most consistent with a benign meningioma.  This was explained to the family  and no further evaluation was necessary.  The patient has multiple other medical problems, to include hypertension,  coronary artery disease and dementia.  Fortunately these were all stable  during this hospitalization.   DISPOSITION:  By August 18, 2003, the patient was cleared for discharge home.  Her vital signs were stable.  She was tolerating her tube feeds without  difficulty. Her hemoglobin  was stable at 8.5.  A urine culture revealed  100,000 Proteus, but susceptibilities remained pending.  The patient was  without complaints.  The patient is being returned to her nursing home of  choice, to continue the PEG tube feeds there.   FOLLOW-UP CARE:  Will be provided by the attending physician at the  patient's nursing home of choice.  The patient's urine culture revealed  greater than 100,000 Proteus just prior to her discharge.   This should be  followed up on, to assure that Cipro is a reasonable antibiotic choice.  Otherwise the basic issues would be to attend to her sacral and heel wounds  religiously, and to reassess her volume and nutritional needs, to determine  if her current free water flushes and nutritional supplement are adequate.                                                Lonia Blood, M.D.    JTM/MEDQ  D:  08/18/2003  T:  08/18/2003  Job:  [id number]

## 2010-05-30 NOTE — Consult Note (Signed)
Summary: Hydronephrosis and Infection       NAME:  [name], [name]                 ACCOUNT NO.:  [id number]      MEDICAL RECORD NO.:  [id number]          PATIENT TYPE:  INP      LOCATION:  1223                         FACILITY:  [facility]      PHYSICIAN:  Mark C. Vernie Ammons, M.D.  DATE OF BIRTH:  1915/05/28      DATE OF CONSULTATION:  05/26/2009   DATE OF DISCHARGE:                                    CONSULTATION      HISTORY:  This patient is a 77 year old female who I was asked to see in   consultation regarding right hydronephrosis and infection.  She is a   resident at a nursing home where she has been complaining for the past   week of right-sided flank pain.  The patient has dementia and is unable   to provide any meaningful history.  Her history was obtained from her   power of attorney who indicated that she has been complaining of pain on   the right side for about a week.  There was no documented fever while at   the nursing home.  She appeared to have decrease in her mental status   earlier in the day and was brought to the emergency room for further   evaluation.  There she was found to have a systolic blood pressure in   the lower 90s and a Foley catheter was placed with frankly purulent   urine returning.  The CT scan was obtained which revealed mild to   moderate right hydronephrosis with hydroureter and I was contacted   regarding the patient.  She is reported to have had UTIs in the past but   only one or two a year.  She has no prior history of kidney stones or   stress or urinary incontinence.      PAST MEDICAL HISTORY:   1. Hypertension.   2. Dementia.   3. Coronary artery disease.   4. Chronic anemia.   5. Dyslipidemia.   6. Peripheral vascular disease.   7. Carotid artery disease.   8. Failure to thrive.   9. History of UTIs.   10.Renal insufficiency.   11.Transvenous pacemaker.   12.History of myocardial infarction in 1982.      SURGICAL HISTORY:   1. 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   2. Upgrade of permanent pacemaker in November 2005.   3. Percutaneous gastrostomy tube placement in April 2005.      MEDICATIONS:  At the nursing home:  Seroquel, amlodipine, Plavix,   Protonix, Lasix, sertraline, Aricept, aspirin, vitamin, Zocor, Senokot,   Depakote, Namenda, Lopressor, morphine.      ALLERGIES:  NKDA.      SOCIAL HISTORY:  The patient resides in a nursing home and does not   smoke or drink there.      FAMILY HISTORY:  Noncontributory.      REVIEW OF SYSTEMS:  Difficult to obtain, but a 13-point review of   systems was attempted  with the patient and assisted by her power of   attorney and her review of systems is positive for dementia, but   negative for incontinence.  It is positive for right flank pain and   otherwise as noted above.      PHYSICAL EXAMINATION:  GENERAL:  The patient appears to be in minimal   distress while lying flat on her bed.   HEENT:  Atraumatic/normocephalic.  She has poor dentition with loss of   multiple teeth.  Oropharynx is otherwise clear.   NECK:  Supple with no JVD with midline trachea.   CHEST:  Normal respiratory effort.   CARDIOVASCULAR:  Grade 2/6 systolic murmur heard in the aortic region.   ABDOMEN:  Soft, nontender and without mass.  She does have positive   right CVAT.   SKIN:  Warm and dry.   EXTREMITIES:  Trace edema and decreased mobility of her right lower   extremity.   NEUROLOGICALLY:  She is demented but able to move all four extremities.      LABORATORY RESULTS:  White count is 14.7, hemoglobin 13.2, hematocrit   39.1, platelets 152,000.  Her urinalysis showed blood and was nitrite   and leukocyte esterase positive with too numerous to count white and red   cells.  Her creatinine was elevated at 3.1.      CT scan reveals a mildly atrophic right kidney with hydronephrosis.  The   hydronephrosis is mild and extends down the ureter to near the  bladder.   No stone is seen within the ureter.  There are no masses seen within the   kidney.  There was perinephric stranding present.  The bladder has a   Foley catheter indwelling.      IMPRESSION:   1. Right pyelonephritis, possibly pyonephrosis with hydronephrosis.   2. Possible sepsis.   3. Acute renal failure on top of chronic renal insufficiency.   4. Hypotension.   5. Clinically she appears to have an obvious urinary tract infection.      PLAN:   1. She will be admitted to the hospital to the step-down unit for       observation but prior to going there I have discussed with her       power of attorney the need to make sure that her right system,       because of the presence of hydronephrosis, is not obstructed.  It       is possible that the ureter could be dilated but not obstructed,       however, due to her advanced age and the significant pyuria seen I       felt the risk was too great to not place a stent to assure adequate       drainage of the kidney.  I went over the procedure as well as its       risks and complications with her power of the attorney who has       elected to proceed with this procedure.  He does understand that if       I am unable to place a stent from below she will likely need to       have a percutaneous nephrostomy tube placed.   2. Her urine culture has been sent as were blood cultures.               Mark C. Vernie Ammons, M.D.         MCO/MEDQ  D:  05/27/2009  T:  05/27/2009  Job:  [id number]      cc:   [facility]         Electronically Signed by Ihor Gully M.D. on 05/29/2009 12:13:00 PM

## 2010-05-30 NOTE — Op Note (Signed)
Summary: PEG Placement   NAME:  [name], [name]                           ACCOUNT NO.:  [id number]   MEDICAL RECORD NO.:  [id number]                   PATIENT TYPE:  INP   LOCATION:  5154                                 FACILITY:  [facility]   PHYSICIAN:  Georgiana Spinner, M.D.                 DATE OF BIRTH:  1915-09-02   DATE OF PROCEDURE:  08/13/2003  DATE OF DISCHARGE:                                 OPERATIVE REPORT   PROCEDURE:  Percutaneous endoscopic gastrostomy tube placement.   ANESTHESIA:  Demerol 30 mg, Versed 3 mg.   INDICATIONS:  Inability to take adequate nourishment.   PROCEDURE:  With the patient mildly sedated in the recumbent position, the  Olympus videoscopic colonoscope was inserted in the mouth, passed under  direct vision into the esophagus, which appeared normal, into the stomach.  The fundus, body, antrum, duodenal bulb, and second portion of the duodenum  were visualized, photographs were taken, and all appeared normal.  From this  point the endoscope was withdrawn taking circumferential views of the  duodenal mucosa until the endoscope had been placed back into the stomach,  placed in retroflexion to view the stomach from below, and a mild hiatal  hernia was seen and photographed.  The endoscope was then straightened and  pulled back from the distal to proximal stomach taking circumferential views  of the gastric mucosa and allowing transillumination to occur on the  abdominal wall. Once the transillumination was seen to occur in the left  upper quadrant and palpation of the abdomen with a finger could be seen to  indent the stomach endoscopically, we then used that spot as our position  for the PEG placement.  The skin was then subsequently prepped and draped in  the usual sterile fashion.  The Xylocaine was injected into the skin to make  a wheal, and in this area I made a short horizontal incision.  Through this  was advanced the trocar and was seen to enter  directly into the stomach  endoscopically.  Through the trocar was passed a guidewire, which was  grasped with the snare through the endoscope and pulled out through the  mouth.  To this was attached the [employer] [place] pull PEG tube, which  was pulled rather easily and placed in position 4 cm removed from the bumper  and while the skin was being dressed in the usual sterile fashion, the  endoscope was reinserted into the mouth, passed into the esophagus and back  into the stomach, and the bumper was seen to be in the correct position in  the stomach.  This was photographed.  The endoscope was withdrawn.  The  patient's vital signs and pulse oximetry remained stable.  The patient  tolerated the procedure well without apparent complication.   FINDINGS:  Unremarkable endoscopic examination with percutaneous endoscopic  gastrostomy tube placement.   PLAN:  Begin using PEG tube.                                               Georgiana Spinner, M.D.    GMO/MEDQ  D:  08/13/2003  T:  08/16/2003  Job:  [id number]

## 2010-07-16 LAB — BASIC METABOLIC PANEL
BUN: 59 mg/dL — ABNORMAL HIGH (ref 6–23)
Chloride: 109 mEq/L (ref 96–112)
GFR calc Af Amer: 18 mL/min — ABNORMAL LOW (ref >60)
GFR calc non Af Amer: 15 mL/min — ABNORMAL LOW (ref >60)
Potassium: 4.1 mEq/L (ref 3.5–5.1)

## 2010-07-16 LAB — URINALYSIS, ROUTINE W REFLEX MICROSCOPIC
Bilirubin Urine: NEGATIVE
Glucose, UA: NEGATIVE mg/dL
Ketones, ur: NEGATIVE mg/dL
Specific Gravity, Urine: 1.015 (ref 1.005–1.030)
pH: 6.5 (ref 5.0–8.0)

## 2010-07-16 LAB — CBC
HCT: 25.7 % — ABNORMAL LOW (ref 36.0–46.0)
HCT: 29.4 % — ABNORMAL LOW (ref 36.0–46.0)
HCT: 32.4 % — ABNORMAL LOW (ref 36.0–46.0)
Hemoglobin: 10.5 g/dL — ABNORMAL LOW (ref 12.0–15.0)
Hemoglobin: 8.2 g/dL — ABNORMAL LOW (ref 12.0–15.0)
MCHC: 31.8 g/dL (ref 30.0–36.0)
MCHC: 32.3 g/dL (ref 30.0–36.0)
MCV: 89.6 fL (ref 78.0–100.0)
MCV: 90.8 fL (ref 78.0–100.0)
MCV: 93.1 fL (ref 78.0–100.0)
Platelets: 107 10*3/uL — ABNORMAL LOW (ref 150–400)
Platelets: 152 10*3/uL (ref 150–400)
Platelets: 74 10*3/uL — ABNORMAL LOW (ref 150–400)
RBC: 2.76 MIL/uL — ABNORMAL LOW (ref 3.87–5.11)
RBC: 3.28 MIL/uL — ABNORMAL LOW (ref 3.87–5.11)
RBC: 3.57 MIL/uL — ABNORMAL LOW (ref 3.87–5.11)
RDW: 13.4 % (ref 11.5–15.5)
RDW: 14.5 % (ref 11.5–15.5)
WBC: 14.7 10*3/uL — ABNORMAL HIGH (ref 4.0–10.5)
WBC: 17.8 10*3/uL — ABNORMAL HIGH (ref 4.0–10.5)
WBC: 26.6 10*3/uL — ABNORMAL HIGH (ref 4.0–10.5)
WBC: 35.4 10*3/uL — ABNORMAL HIGH (ref 4.0–10.5)

## 2010-07-16 LAB — DIFFERENTIAL
Basophils Absolute: 0 10*3/uL (ref 0.0–0.1)
Basophils Absolute: 0.1 10*3/uL (ref 0.0–0.1)
Basophils Relative: 0 % (ref 0–1)
Eosinophils Absolute: 0 10*3/uL (ref 0.0–0.7)
Eosinophils Relative: 0 % (ref 0–5)
Lymphocytes Relative: 2 % — ABNORMAL LOW (ref 12–46)
Lymphocytes Relative: 5 % — ABNORMAL LOW (ref 12–46)
Lymphs Abs: 0.4 10*3/uL — ABNORMAL LOW (ref 0.7–4.0)
Lymphs Abs: 0.7 10*3/uL (ref 0.7–4.0)
Monocytes Absolute: 0.2 10*3/uL (ref 0.1–1.0)
Monocytes Relative: 1 % — ABNORMAL LOW (ref 3–12)
Neutro Abs: 13.9 10*3/uL — ABNORMAL HIGH (ref 1.7–7.7)
Neutro Abs: 17.2 10*3/uL — ABNORMAL HIGH (ref 1.7–7.7)
Neutrophils Relative %: 95 % — ABNORMAL HIGH (ref 43–77)
Neutrophils Relative %: 97 % — ABNORMAL HIGH (ref 43–77)

## 2010-07-16 LAB — POCT I-STAT, CHEM 8
BUN: 44 mg/dL — ABNORMAL HIGH (ref 6–23)
HCT: 43 % (ref 36.0–46.0)
TCO2: 17 mmol/L (ref 0–100)

## 2010-07-16 LAB — COMPREHENSIVE METABOLIC PANEL
ALT: 19 U/L (ref 0–35)
ALT: 29 U/L (ref 0–35)
AST: 42 U/L — ABNORMAL HIGH (ref 0–37)
AST: 70 U/L — ABNORMAL HIGH (ref 0–37)
Albumin: 1.6 g/dL — ABNORMAL LOW (ref 3.5–5.2)
Alkaline Phosphatase: 17 U/L — ABNORMAL LOW (ref 39–117)
Alkaline Phosphatase: 22 U/L — ABNORMAL LOW (ref 39–117)
BUN: 42 mg/dL — ABNORMAL HIGH (ref 6–23)
BUN: 49 mg/dL — ABNORMAL HIGH (ref 6–23)
CO2: 16 mEq/L — ABNORMAL LOW (ref 19–32)
CO2: 17 mEq/L — ABNORMAL LOW (ref 19–32)
CO2: 26 mEq/L (ref 19–32)
Calcium: 6.6 mg/dL — ABNORMAL LOW (ref 8.4–10.5)
Calcium: 6.7 mg/dL — ABNORMAL LOW (ref 8.4–10.5)
Calcium: 6.8 mg/dL — ABNORMAL LOW (ref 8.4–10.5)
Chloride: 104 mEq/L (ref 96–112)
Chloride: 113 mEq/L — ABNORMAL HIGH (ref 96–112)
Creatinine, Ser: 3.14 mg/dL — ABNORMAL HIGH (ref 0.4–1.2)
Creatinine, Ser: 3.38 mg/dL — ABNORMAL HIGH (ref 0.4–1.2)
GFR calc Af Amer: 15 mL/min — ABNORMAL LOW (ref >60)
GFR calc Af Amer: 16 mL/min — ABNORMAL LOW (ref >60)
GFR calc non Af Amer: 13 mL/min — ABNORMAL LOW (ref >60)
GFR calc non Af Amer: 14 mL/min — ABNORMAL LOW (ref >60)
Glucose, Bld: 118 mg/dL — ABNORMAL HIGH (ref 70–99)
Glucose, Bld: 98 mg/dL (ref 70–99)
Potassium: 3.8 mEq/L (ref 3.5–5.1)
Sodium: 138 mEq/L (ref 135–145)
Total Bilirubin: 0.7 mg/dL (ref 0.3–1.2)
Total Bilirubin: 0.7 mg/dL (ref 0.3–1.2)
Total Protein: 4.2 g/dL — ABNORMAL LOW (ref 6.0–8.3)

## 2010-07-16 LAB — URINE MICROSCOPIC-ADD ON

## 2010-07-16 LAB — CROSSMATCH: ABO/RH(D): O POS

## 2010-07-16 LAB — MAGNESIUM
Magnesium: 1.8 mg/dL (ref 1.5–2.5)
Magnesium: 2 mg/dL (ref 1.5–2.5)

## 2010-07-16 LAB — URINE CULTURE

## 2010-07-16 LAB — VALPROIC ACID LEVEL: Valproic Acid Lvl: <10 ug/mL — ABNORMAL LOW (ref 50.0–100.0)

## 2010-07-16 LAB — POCT CARDIAC MARKERS: Myoglobin, poc: >500 ng/mL (ref 12–200)

## 2010-07-16 LAB — ABO/RH: ABO/RH(D): O POS

## 2010-07-16 LAB — LACTIC ACID, PLASMA
Lactic Acid, Venous: 6 mmol/L — ABNORMAL HIGH (ref 0.5–2.2)
Lactic Acid, Venous: 6.1 mmol/L — ABNORMAL HIGH (ref 0.5–2.2)

## 2010-07-19 LAB — GLUCOSE, CAPILLARY
Glucose-Capillary: 109 mg/dL — ABNORMAL HIGH (ref 70–99)
Glucose-Capillary: 113 mg/dL — ABNORMAL HIGH (ref 70–99)
Glucose-Capillary: 116 mg/dL — ABNORMAL HIGH (ref 70–99)
Glucose-Capillary: 119 mg/dL — ABNORMAL HIGH (ref 70–99)
Glucose-Capillary: 127 mg/dL — ABNORMAL HIGH (ref 70–99)
Glucose-Capillary: 127 mg/dL — ABNORMAL HIGH (ref 70–99)
Glucose-Capillary: 129 mg/dL — ABNORMAL HIGH (ref 70–99)
Glucose-Capillary: 132 mg/dL — ABNORMAL HIGH (ref 70–99)
Glucose-Capillary: 143 mg/dL — ABNORMAL HIGH (ref 70–99)
Glucose-Capillary: 162 mg/dL — ABNORMAL HIGH (ref 70–99)
Glucose-Capillary: 76 mg/dL (ref 70–99)
Glucose-Capillary: 79 mg/dL (ref 70–99)
Glucose-Capillary: 79 mg/dL (ref 70–99)
Glucose-Capillary: 80 mg/dL (ref 70–99)
Glucose-Capillary: 84 mg/dL (ref 70–99)
Glucose-Capillary: 92 mg/dL (ref 70–99)

## 2010-07-19 LAB — DIFFERENTIAL
Basophils Absolute: 0 10*3/uL (ref 0.0–0.1)
Basophils Absolute: 0 10*3/uL (ref 0.0–0.1)
Basophils Relative: 0 % (ref 0–1)
Basophils Relative: 0 % (ref 0–1)
Basophils Relative: 0 % (ref 0–1)
Basophils Relative: 0 % (ref 0–1)
Eosinophils Absolute: 0 10*3/uL (ref 0.0–0.7)
Eosinophils Absolute: 0.4 10*3/uL (ref 0.0–0.7)
Eosinophils Absolute: 0.5 10*3/uL (ref 0.0–0.7)
Eosinophils Absolute: 0.6 10*3/uL (ref 0.0–0.7)
Eosinophils Relative: 0 % (ref 0–5)
Eosinophils Relative: 0 % (ref 0–5)
Eosinophils Relative: 1 % (ref 0–5)
Eosinophils Relative: 3 % (ref 0–5)
Eosinophils Relative: 3 % (ref 0–5)
Lymphocytes Relative: 3 % — ABNORMAL LOW (ref 12–46)
Lymphocytes Relative: 4 % — ABNORMAL LOW (ref 12–46)
Lymphocytes Relative: 8 % — ABNORMAL LOW (ref 12–46)
Lymphs Abs: 1.3 10*3/uL (ref 0.7–4.0)
Lymphs Abs: 1.6 10*3/uL (ref 0.7–4.0)
Lymphs Abs: 1.6 10*3/uL (ref 0.7–4.0)
Lymphs Abs: 1.7 10*3/uL (ref 0.7–4.0)
Lymphs Abs: 1.7 10*3/uL (ref 0.7–4.0)
Lymphs Abs: 2.3 10*3/uL (ref 0.7–4.0)
Monocytes Absolute: 0.9 10*3/uL (ref 0.1–1.0)
Monocytes Absolute: 1 10*3/uL (ref 0.1–1.0)
Monocytes Relative: 3 % (ref 3–12)
Monocytes Relative: 4 % (ref 3–12)
Neutro Abs: 17.5 10*3/uL — ABNORMAL HIGH (ref 1.7–7.7)
Neutro Abs: 38.7 10*3/uL — ABNORMAL HIGH (ref 1.7–7.7)
Neutro Abs: 40.6 10*3/uL — ABNORMAL HIGH (ref 1.7–7.7)
Neutrophils Relative %: 85 % — ABNORMAL HIGH (ref 43–77)
WBC Morphology: INCREASED

## 2010-07-19 LAB — BASIC METABOLIC PANEL
BUN: 42 mg/dL — ABNORMAL HIGH (ref 6–23)
BUN: 53 mg/dL — ABNORMAL HIGH (ref 6–23)
BUN: 69 mg/dL — ABNORMAL HIGH (ref 6–23)
CO2: 26 mEq/L (ref 19–32)
CO2: 27 mEq/L (ref 19–32)
CO2: 29 mEq/L (ref 19–32)
Calcium: 7.8 mg/dL — ABNORMAL LOW (ref 8.4–10.5)
Chloride: 113 mEq/L — ABNORMAL HIGH (ref 96–112)
Chloride: 114 mEq/L — ABNORMAL HIGH (ref 96–112)
Chloride: 115 mEq/L — ABNORMAL HIGH (ref 96–112)
Creatinine, Ser: 1.59 mg/dL — ABNORMAL HIGH (ref 0.4–1.2)
Creatinine, Ser: 1.78 mg/dL — ABNORMAL HIGH (ref 0.4–1.2)
GFR calc Af Amer: 22 mL/min — ABNORMAL LOW (ref >60)
GFR calc Af Amer: 32 mL/min — ABNORMAL LOW (ref >60)
GFR calc Af Amer: 37 mL/min — ABNORMAL LOW (ref >60)
GFR calc non Af Amer: 18 mL/min — ABNORMAL LOW (ref >60)
GFR calc non Af Amer: 23 mL/min — ABNORMAL LOW (ref >60)
GFR calc non Af Amer: 30 mL/min — ABNORMAL LOW (ref >60)
Glucose, Bld: 110 mg/dL — ABNORMAL HIGH (ref 70–99)
Glucose, Bld: 120 mg/dL — ABNORMAL HIGH (ref 70–99)
Glucose, Bld: 126 mg/dL — ABNORMAL HIGH (ref 70–99)
Glucose, Bld: 97 mg/dL (ref 70–99)
Potassium: 3 mEq/L — ABNORMAL LOW (ref 3.5–5.1)
Potassium: 3.1 mEq/L — ABNORMAL LOW (ref 3.5–5.1)
Potassium: 3.6 mEq/L (ref 3.5–5.1)
Potassium: 4.1 mEq/L (ref 3.5–5.1)
Sodium: 147 mEq/L — ABNORMAL HIGH (ref 135–145)
Sodium: 149 mEq/L — ABNORMAL HIGH (ref 135–145)

## 2010-07-19 LAB — COMPREHENSIVE METABOLIC PANEL
ALT: 31 U/L (ref 0–35)
ALT: 44 U/L — ABNORMAL HIGH (ref 0–35)
AST: 32 U/L (ref 0–37)
Albumin: 2 g/dL — ABNORMAL LOW (ref 3.5–5.2)
BUN: 15 mg/dL (ref 6–23)
BUN: 38 mg/dL — ABNORMAL HIGH (ref 6–23)
CO2: 26 mEq/L (ref 19–32)
CO2: 26 mEq/L (ref 19–32)
Calcium: 7.5 mg/dL — ABNORMAL LOW (ref 8.4–10.5)
Calcium: 7.6 mg/dL — ABNORMAL LOW (ref 8.4–10.5)
Calcium: 7.7 mg/dL — ABNORMAL LOW (ref 8.4–10.5)
Creatinine, Ser: 1.17 mg/dL (ref 0.4–1.2)
Creatinine, Ser: 1.55 mg/dL — ABNORMAL HIGH (ref 0.4–1.2)
Creatinine, Ser: 1.68 mg/dL — ABNORMAL HIGH (ref 0.4–1.2)
GFR calc Af Amer: 34 mL/min — ABNORMAL LOW (ref >60)
GFR calc non Af Amer: 28 mL/min — ABNORMAL LOW (ref >60)
GFR calc non Af Amer: 31 mL/min — ABNORMAL LOW (ref >60)
GFR calc non Af Amer: 43 mL/min — ABNORMAL LOW (ref >60)
Glucose, Bld: 107 mg/dL — ABNORMAL HIGH (ref 70–99)
Glucose, Bld: 110 mg/dL — ABNORMAL HIGH (ref 70–99)
Sodium: 140 mEq/L (ref 135–145)
Sodium: 141 mEq/L (ref 135–145)
Sodium: 144 mEq/L (ref 135–145)
Total Protein: 4.8 g/dL — ABNORMAL LOW (ref 6.0–8.3)
Total Protein: 5.8 g/dL — ABNORMAL LOW (ref 6.0–8.3)

## 2010-07-19 LAB — CBC
HCT: 23.4 % — ABNORMAL LOW (ref 36.0–46.0)
HCT: 26 % — ABNORMAL LOW (ref 36.0–46.0)
HCT: 27.4 % — ABNORMAL LOW (ref 36.0–46.0)
HCT: 31.5 % — ABNORMAL LOW (ref 36.0–46.0)
HCT: 31.6 % — ABNORMAL LOW (ref 36.0–46.0)
HCT: 31.9 % — ABNORMAL LOW (ref 36.0–46.0)
Hemoglobin: 10.5 g/dL — ABNORMAL LOW (ref 12.0–15.0)
Hemoglobin: 10.6 g/dL — ABNORMAL LOW (ref 12.0–15.0)
Hemoglobin: 11.3 g/dL — ABNORMAL LOW (ref 12.0–15.0)
Hemoglobin: 8.7 g/dL — ABNORMAL LOW (ref 12.0–15.0)
Hemoglobin: 9.4 g/dL — ABNORMAL LOW (ref 12.0–15.0)
MCHC: 33.3 g/dL (ref 30.0–36.0)
MCHC: 33.3 g/dL (ref 30.0–36.0)
MCHC: 33.6 g/dL (ref 30.0–36.0)
MCHC: 33.7 g/dL (ref 30.0–36.0)
MCHC: 33.8 g/dL (ref 30.0–36.0)
MCHC: 34.2 g/dL (ref 30.0–36.0)
MCV: 89.6 fL (ref 78.0–100.0)
MCV: 89.7 fL (ref 78.0–100.0)
MCV: 89.8 fL (ref 78.0–100.0)
MCV: 90.5 fL (ref 78.0–100.0)
MCV: 90.7 fL (ref 78.0–100.0)
MCV: 91 fL (ref 78.0–100.0)
MCV: 91.2 fL (ref 78.0–100.0)
MCV: 91.3 fL (ref 78.0–100.0)
Platelets: 34 10*3/uL — ABNORMAL LOW (ref 150–400)
Platelets: 42 10*3/uL — ABNORMAL LOW (ref 150–400)
Platelets: 59 10*3/uL — ABNORMAL LOW (ref 150–400)
Platelets: 77 10*3/uL — ABNORMAL LOW (ref 150–400)
RBC: 2.86 MIL/uL — ABNORMAL LOW (ref 3.87–5.11)
RBC: 3.05 MIL/uL — ABNORMAL LOW (ref 3.87–5.11)
RBC: 3.15 MIL/uL — ABNORMAL LOW (ref 3.87–5.11)
RBC: 3.49 MIL/uL — ABNORMAL LOW (ref 3.87–5.11)
RBC: 3.71 MIL/uL — ABNORMAL LOW (ref 3.87–5.11)
RDW: 15.4 % (ref 11.5–15.5)
RDW: 15.4 % (ref 11.5–15.5)
RDW: 15.8 % — ABNORMAL HIGH (ref 11.5–15.5)
RDW: 15.8 % — ABNORMAL HIGH (ref 11.5–15.5)
RDW: 15.8 % — ABNORMAL HIGH (ref 11.5–15.5)
RDW: 16.1 % — ABNORMAL HIGH (ref 11.5–15.5)
WBC: 13.5 10*3/uL — ABNORMAL HIGH (ref 4.0–10.5)

## 2010-07-19 LAB — TRIGLYCERIDES: Triglycerides: 86 mg/dL (ref <150)

## 2010-07-19 LAB — HEMOCCULT GUIAC POC 1CARD (OFFICE): Fecal Occult Bld: POSITIVE

## 2010-07-19 LAB — CULTURE, BLOOD (ROUTINE X 2)

## 2010-07-19 LAB — IRON AND TIBC
Saturation Ratios: 7 % — ABNORMAL LOW (ref 20–55)
TIBC: 168 ug/dL — ABNORMAL LOW (ref 250–470)

## 2010-07-19 LAB — CROSSMATCH: ABO/RH(D): O POS

## 2010-07-19 LAB — DIC (DISSEMINATED INTRAVASCULAR COAGULATION)PANEL
D-Dimer, Quant: 10.48 ug/mL-FEU — ABNORMAL HIGH (ref 0.00–0.48)
Fibrinogen: 273 mg/dL (ref 204–475)
Platelets: 35 10*3/uL — ABNORMAL LOW (ref 150–400)
Prothrombin Time: 16.5 seconds — ABNORMAL HIGH (ref 11.6–15.2)

## 2010-07-19 LAB — MAGNESIUM
Magnesium: 1.6 mg/dL (ref 1.5–2.5)
Magnesium: 1.6 mg/dL (ref 1.5–2.5)
Magnesium: 1.6 mg/dL (ref 1.5–2.5)

## 2010-07-19 LAB — PREALBUMIN: Prealbumin: 17 mg/dL — ABNORMAL LOW (ref 18.0–45.0)

## 2010-07-19 LAB — PHOSPHORUS: Phosphorus: 3.1 mg/dL (ref 2.3–4.6)

## 2010-07-19 LAB — HEPARIN INDUCED THROMBOCYTOPENIA PNL: Heparin Induced Plt Ab: NEGATIVE

## 2010-07-19 LAB — FERRITIN: Ferritin: 635 ng/mL — ABNORMAL HIGH (ref 10–291)

## 2010-07-19 LAB — CHOLESTEROL, TOTAL: Cholesterol: 125 mg/dL (ref 0–200)

## 2010-08-17 IMAGING — CR DG CHEST 1V PORT
1 series · 1 of 1 positions shown · non-contrast
Comparison: 05/28/2009

CLINICAL DATA: Septic shock.  Evaluate PICC line placement.

PORTABLE CHEST - 1 VIEW

[view not recorded]
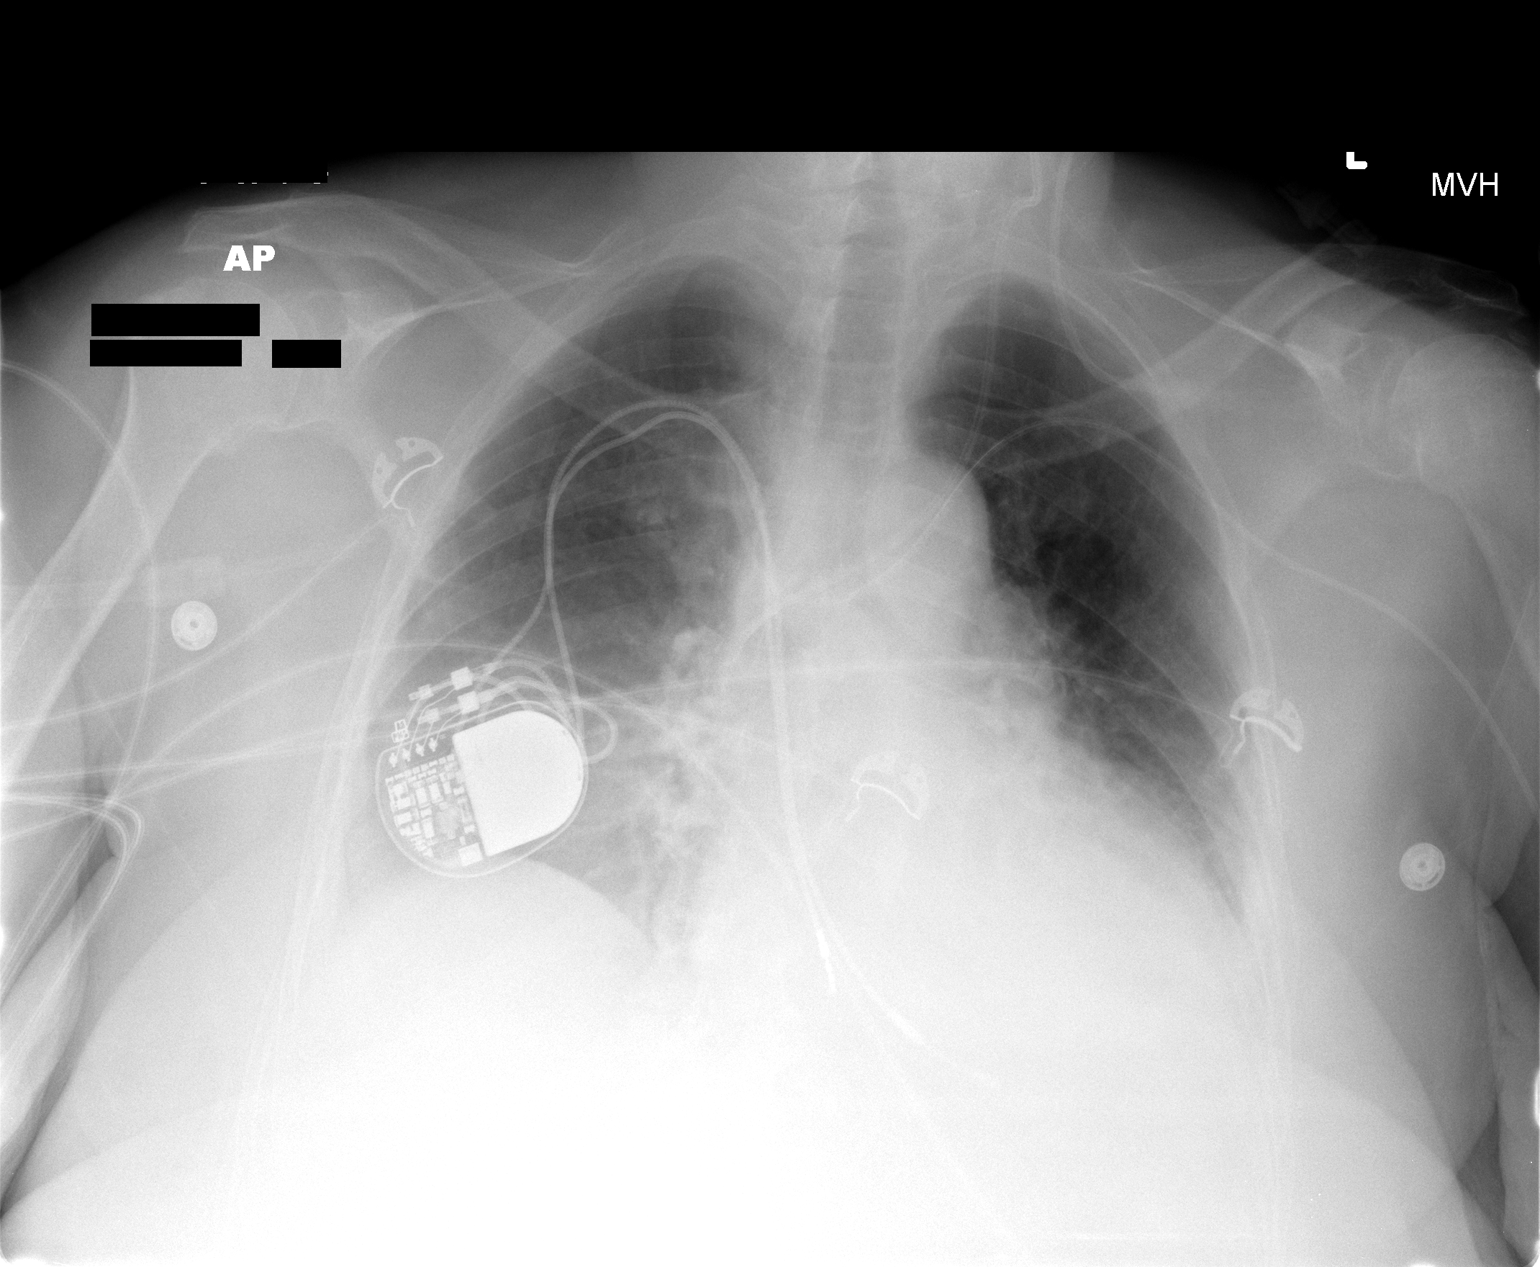

[1 of 1 positions shown; findings below may reference images not displayed]

FINDINGS: Portable view of the chest demonstrates hazy lung
densities suggestive for mild edema.  Cardiac silhouette is stable.
There is a right-sided cardiac pacemaker.  The patient has a left
PICC line that crosses the midline but extends cephalad into the
right innominate vein region.  The catheter is pointing up.  Left
jugular central line is in the upper SVC region.  Persistent
opacification at the left lung base is suggestive for atelectasis
and probably pleural fluid.
IMPRESSION: Left PICC line is pointing up in the right innominate vein area.
This is not an ideal placement.

Persistent left lung base opacification.

## 2010-08-21 IMAGING — CR DG CHEST 1V PORT
1 series · 1 of 1 positions shown · non-contrast
Comparison: 05/29/2009

CLINICAL DATA: Leukocytosis, septic shock

PORTABLE CHEST - 1 VIEW

[view not recorded]
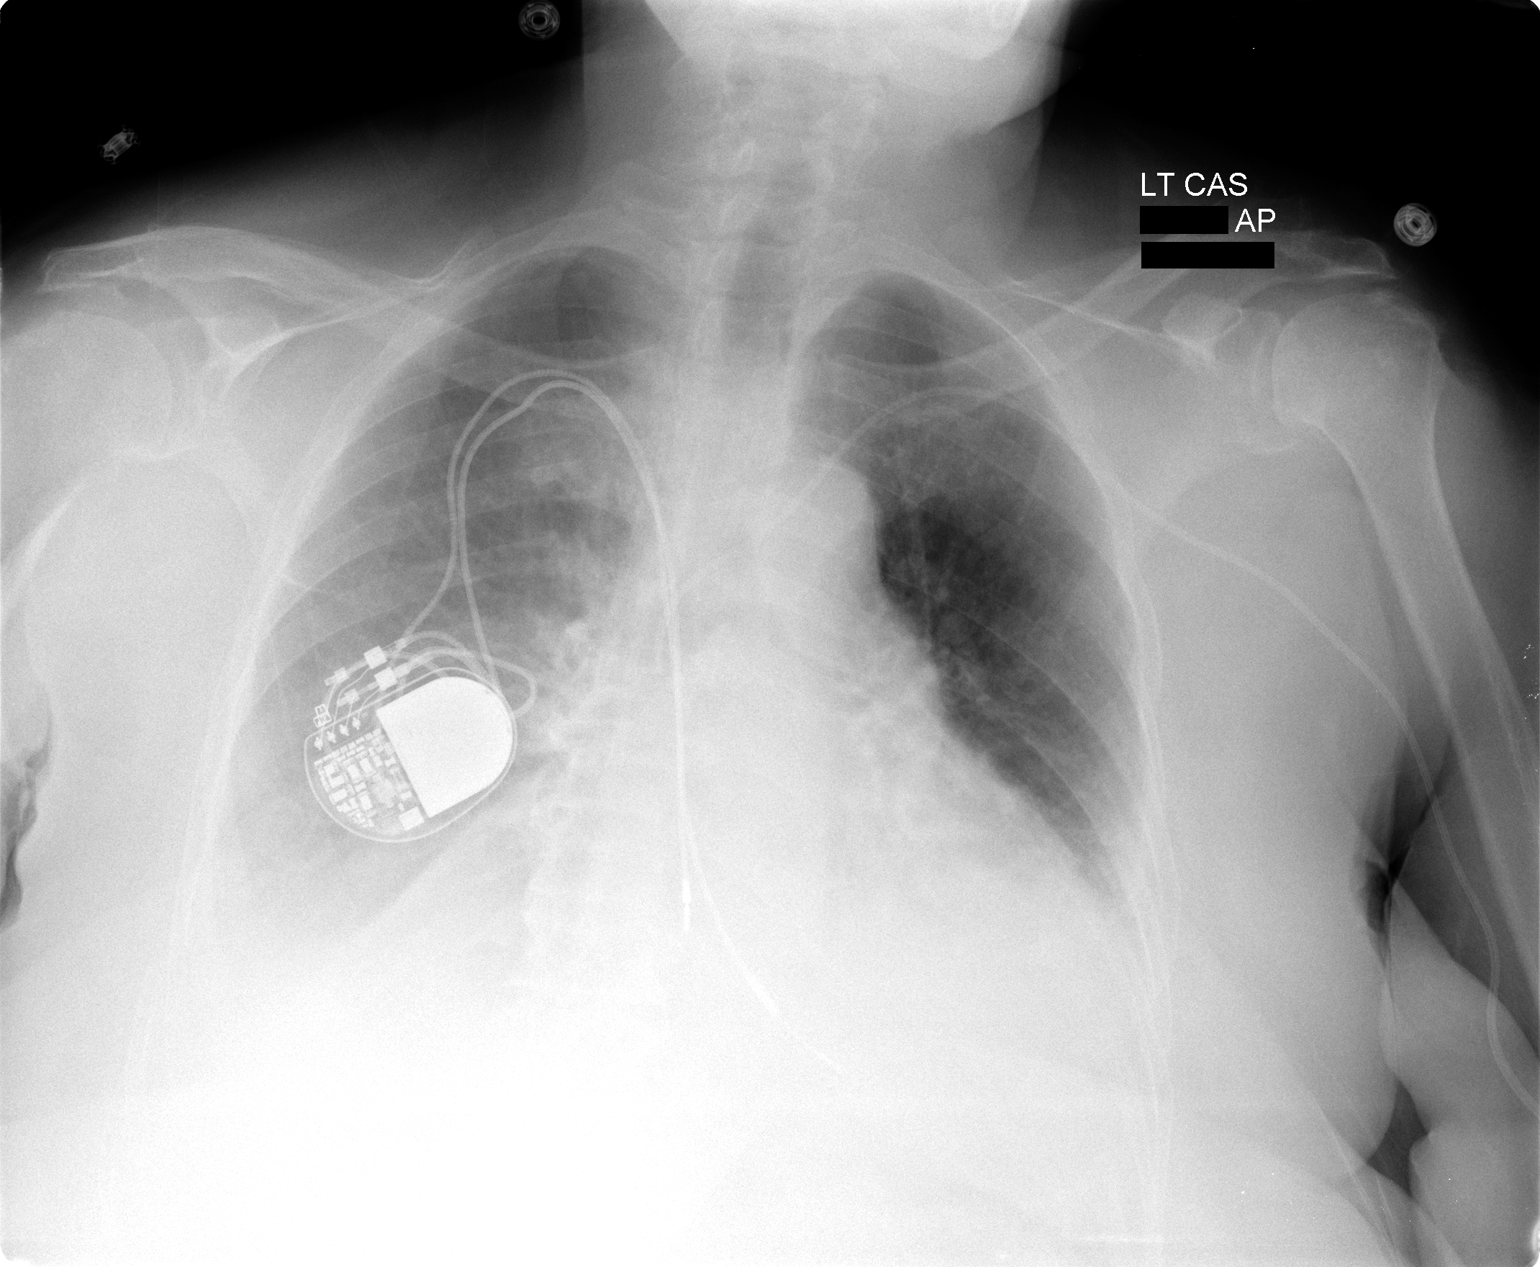

[1 of 1 positions shown; findings below may reference images not displayed]

FINDINGS: Left arm PICC and right subclavian pacemaker are stable
in position.  Heart size upper limits normal.  Probable layering
pleural effusions.  There is associated atelectasis or
consolidation in the lung bases.  Mild central pulmonary vascular
congestion is suspected.
IMPRESSION: 1.  Probable layering effusions with bibasilar
atelectasis/consolidation.
2. Support hardware stable in position.

## 2010-08-21 IMAGING — US US RENAL
1 series · 14 of 25 positions shown · non-contrast
Comparison: CT abdomen and pelvis 05/26/2009.

CLINICAL DATA: Septic shock.  Right-sided pyelonephritis.

RENAL/URINARY TRACT ULTRASOUND COMPLETE

[Series 1: us renal · 0.23mm/px · 14 of 28 slices shown]
[im 1/28]
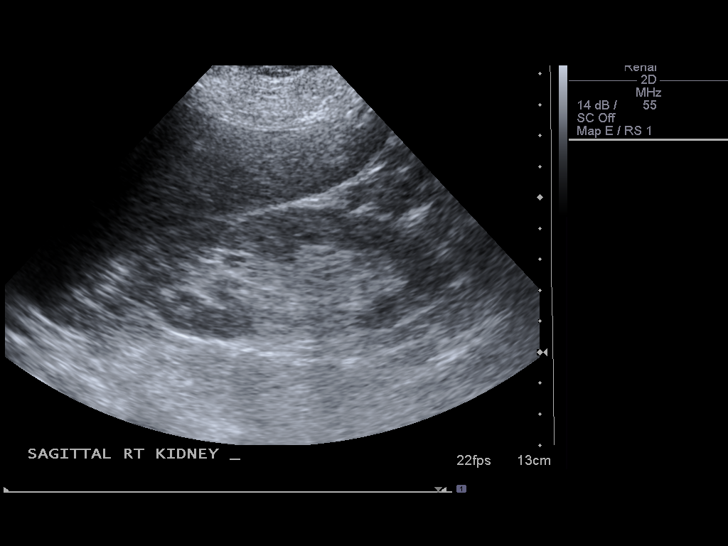
[im 3/28]
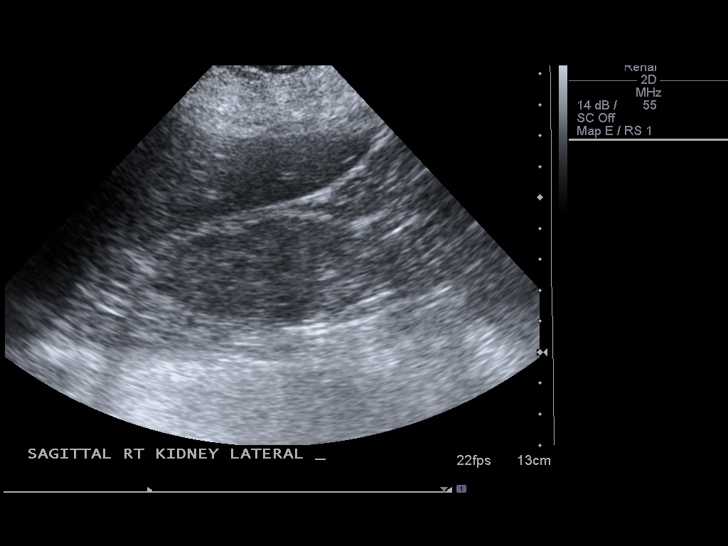
[im 5/28]
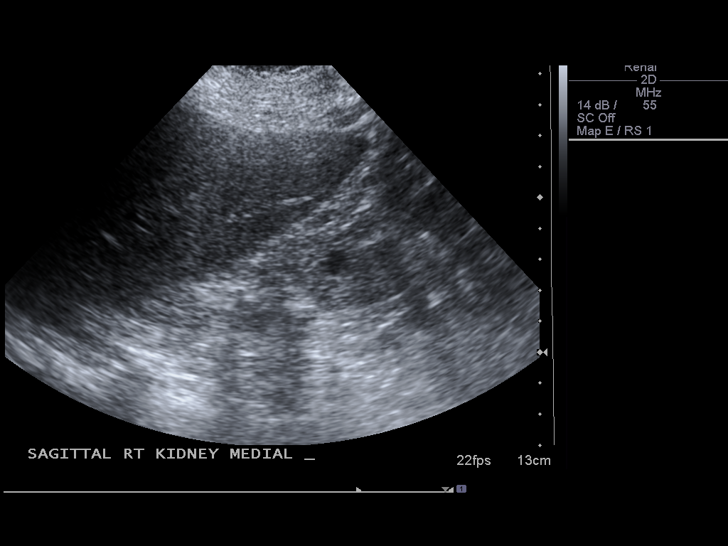
[im 7/28]
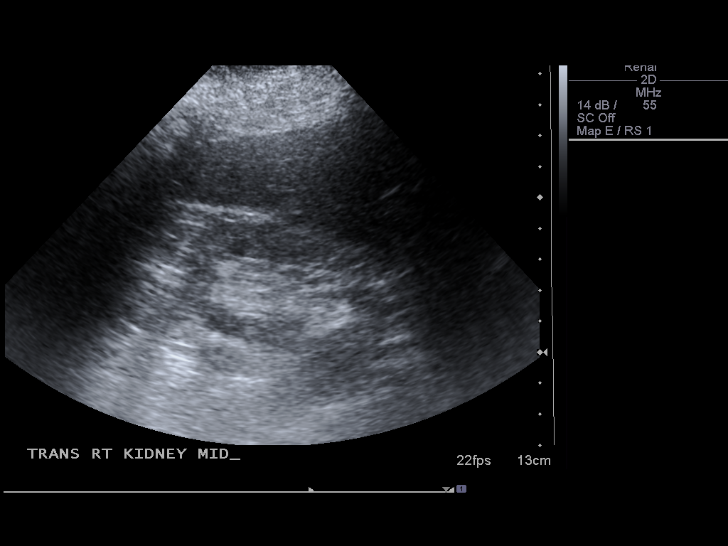
[im 10/28]
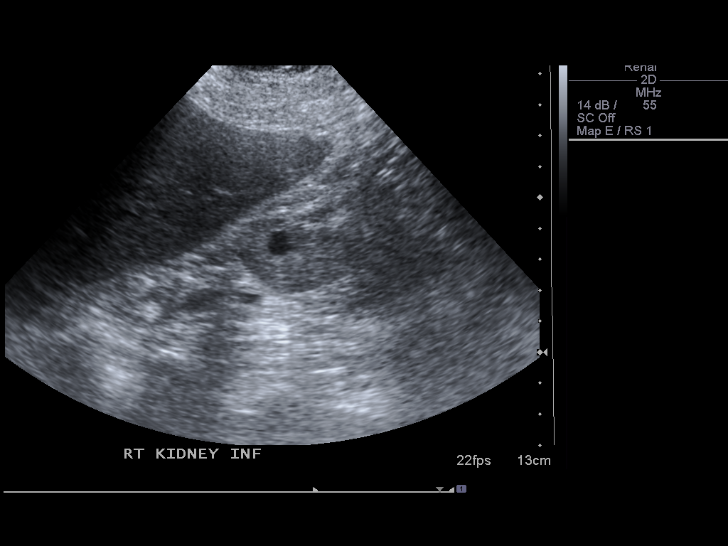
[im 11/28]
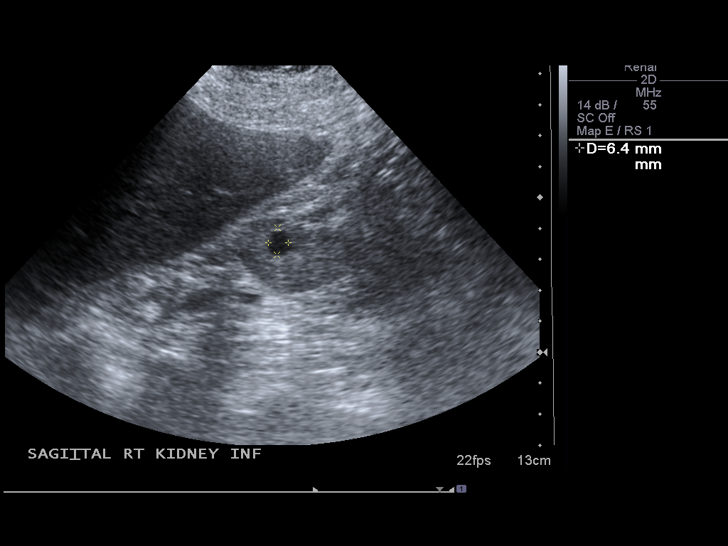
[im 13/28]
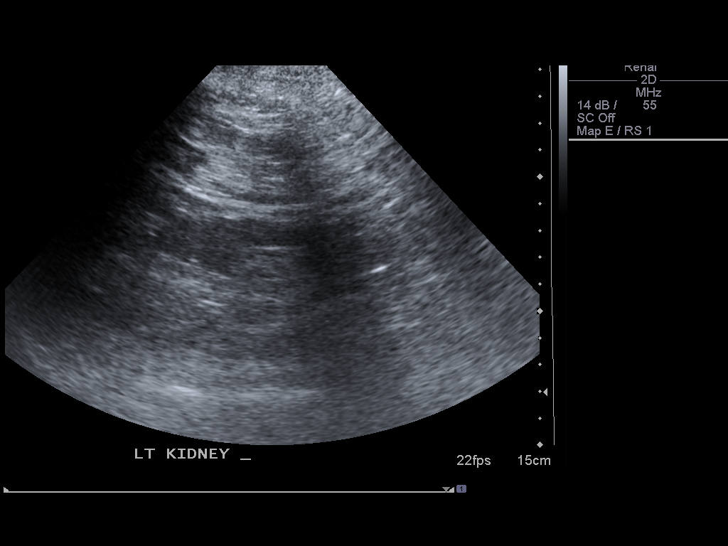
[im 15/28]
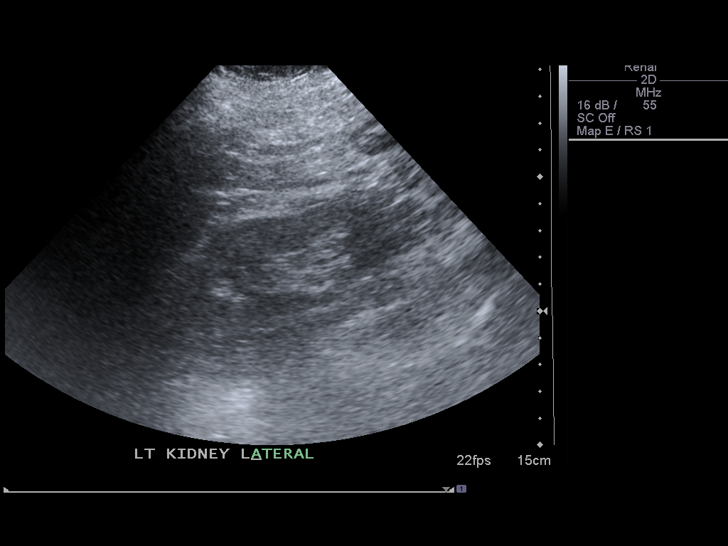
[im 17/28]
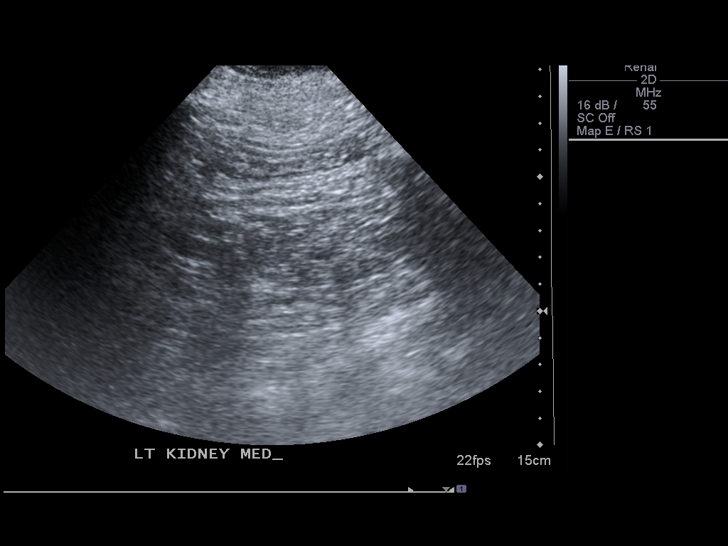
[im 19/28]
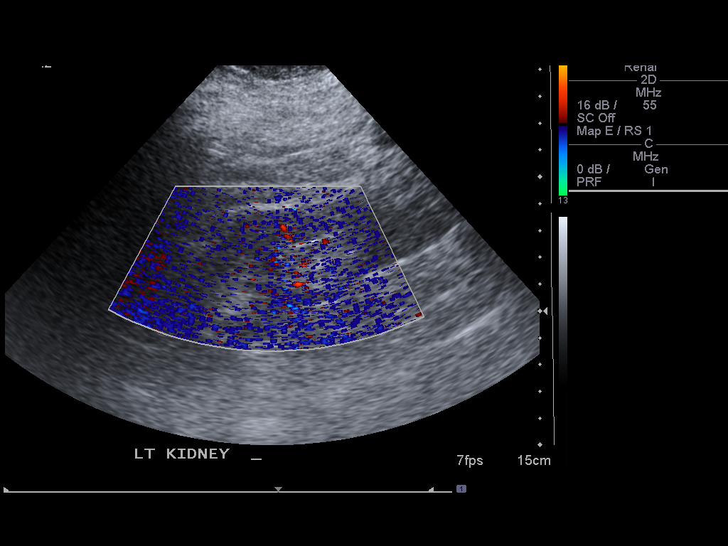
[im 21/28]
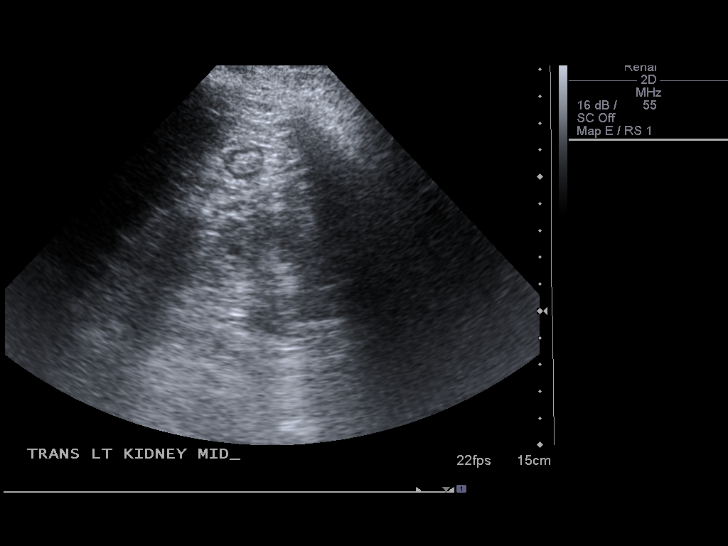
[im 23/28]
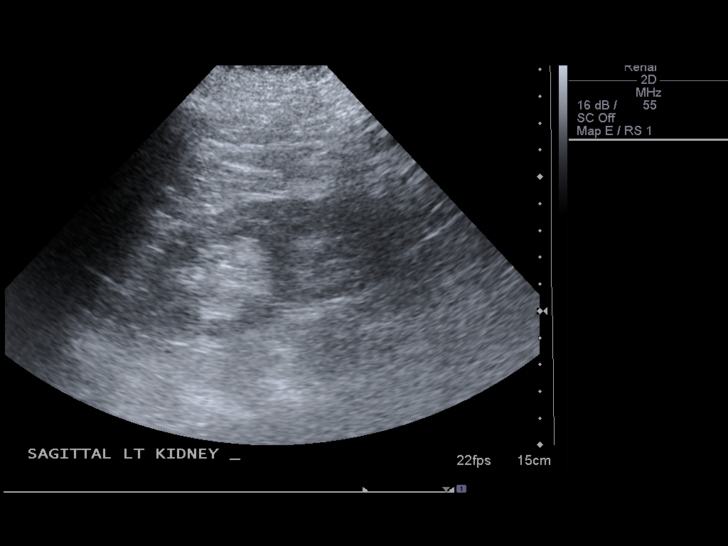
[im 25/28]
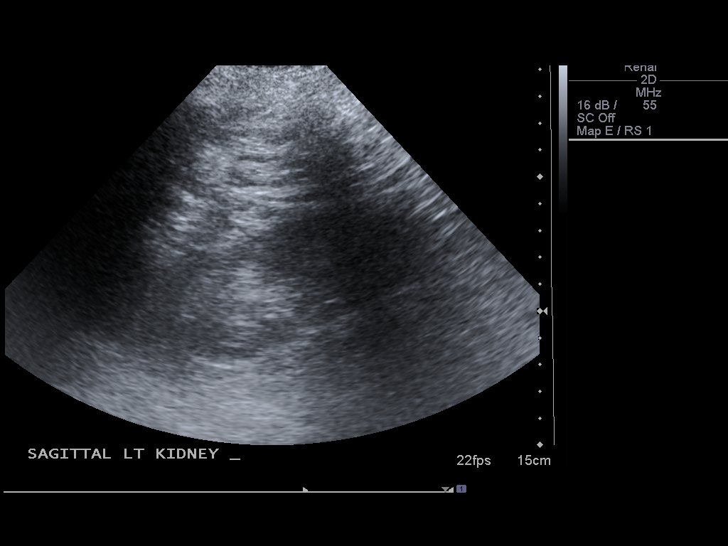
[im 28/28]
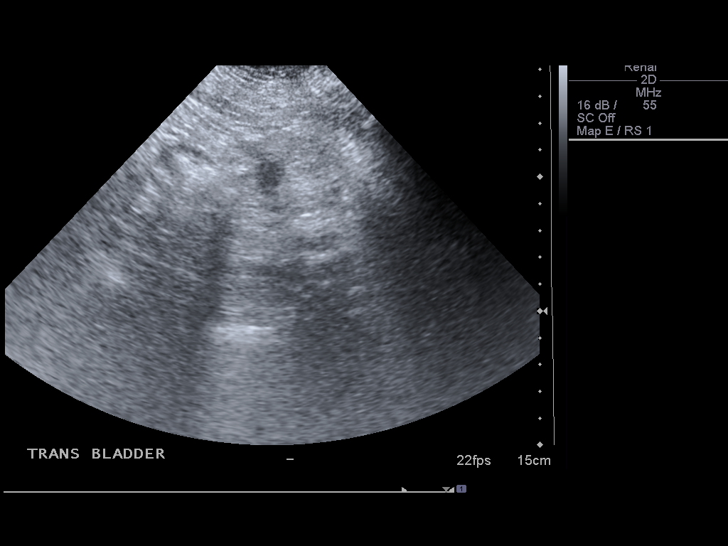

[14 of 25 positions shown; findings below may reference images not displayed]

FINDINGS: Right Kidney:  Right-sided hydronephrosis is no longer evident.  A
simple cystic lesion at the lower pole of the right kidney measures
10 x 9 x 6 mm.  The overall size of the right kidney is 10.1 cm.

Left Kidney:  The left kidney is unremarkable.  No focal mass
lesion, stone, or hydronephrosis is present.  The kidney measures
10.1 cm.

Bladder:  The urinary bladder is collapsed.  A Foley catheter is in
place.
IMPRESSION: 1.  Interval resolution of right-sided hydronephrosis.
2.  10 mm cystic lesion at the lower pole of the right kidney
appears simple.
3.  Difficult visualization of the left kidney appears to be within
normal limits.
4.  A Foley catheter is in place.

## 2010-09-12 NOTE — Op Note (Signed)
NAMESAIGE, [name]                 ACCOUNT NO.:  [id number]   MEDICAL RECORD NO.:  [id number]          PATIENT TYPE:  OIB   LOCATION:  5122                         FACILITY:  [facility]   PHYSICIAN:  Antony Contras, MD     DATE OF BIRTH:  1915/11/18   DATE OF PROCEDURE:  05/23/2007  DATE OF DISCHARGE:                               OPERATIVE REPORT   PREOPERATIVE DIAGNOSIS:  Right parotid gland mass.   POSTOPERATIVE DIAGNOSIS:  Right parotid gland mass.   OPERATION/PROCEDURE:  Right superficial parotidectomy with dissection of  the facial nerve.   SURGEON:  Antony Contras, M.D.   ASSISTANT:  Lucky Cowboy, M.D.   COMPLICATIONS:  None.   ANESTHESIA:  General endotracheal anesthesia.   INDICATIONS:  The patient is a 77 year old African American female who  has had a mass in the right neck for least a couple of months.  She is  found to have a large bilobed mass in the right parotid tail and lower  parotid gland.  She presents to the operating room for surgical  management.   FINDINGS:  The mass is in two lobes and has a cystic appearance.   DESCRIPTION OF PROCEDURE:  The patient is identified in the holding  room, informed consent having been obtained including discussion of  risks, benefits, alternatives, the patient was brought to the operative  suite and put on the operative table in supine position.  Anesthesia was  induced.  The patient is intubated by anesthesia team with mild  difficulty.  The patient was given intravenous antibiotics during the  case.  The eyes were taped closed and the patient was placed on a  shoulder roll.  The incision was marked with a marking pen and injected  with 1% lidocaine 1:100,000 epinephrine.  The nerve integrity monitor  was placed in the standard fashion and turned on.  The right face was  then prepped and draped in the sterile fashion.  Incision was made a 15  blade scalpel in the preauricular sulcus, then curving down onto the  neck.  Bovie  electrocautery was then used to divide subcutaneous tissues  down to the pre parotid plane.  A skin flap was then elevated over the  gland and the mass.  The flaps were then sewn back.  Dissection was then  performed along the, along the tragal cartilage down toward the  stylomastoid foramen.  Ultimately, the facial nerve was identified  visually.  Dissection was then performed in an antegrade fashion  following the branches of the nerve and dividing the gland through about  the mid point of the gland leaving the superior gland in place.  Each  branch of the nerve was dissected, elevating the gland in an inferior  direction toward the mass.  This included then dissecting the inferior  branches toward the gland.  A couple of branches extended under the mass  that were carefully dissected free.  Small fibrous branches were divided  in order to free the mass.  Dissection continued around the periphery of  the mass anteriorly, keeping the nerve  branches safe.  Dissection  continued until the facial nerve was free.  The tumor was then dissected  off sternocleidomastoid muscle and was dissected from the surrounding  tissues and removed.  The nerve integrity monitor was used to monitor  the nerve and was used for nerve stimulation.  After this was removed,  the site was copiously irrigated with saline.  During the latter part of  the dissection, the lower branches did stimulate with a nerve  stimulator.  A 7-French round drain was then placed within the wound and  secured at the inferior extent of the incision using 2-0 nylon suture in  a standard drain stitch.  The subcutaneous layer was then closed with 3-  0 Vicryl suture in a simple running fashion.  The skin was closed with 5-  0 nylon in a simple running fashion.  The drain was hooked to bulb  suction and attached to the right shoulder.  The drapes were removed.  The patient returned to anesthesia for awakening, was extubated and  moved to  the recovery room in stable condition.      Antony Contras, MD  Electronically Signed     DDB/MEDQ  D:  05/23/2007  T:  05/24/2007  Job:  [id number]

## 2010-09-15 NOTE — Discharge Summary (Signed)
NAMEJALON, [name]                           ACCOUNT NO.:  [id number]   MEDICAL RECORD NO.:  [id number]                   PATIENT TYPE:  INP   LOCATION:  5511                                 FACILITY:  [facility]   PHYSICIAN:  Lonia Blood, M.D.            DATE OF BIRTH:  1916-03-28   DATE OF ADMISSION:  08/26/2002  DATE OF DISCHARGE:  08/28/2002                                 DISCHARGE SUMMARY   DISCHARGE DIAGNOSES:  1. Chest wall pain - consistent with costochondritis, responding well to     Vioxx therapy.     a. Subjective history of palpitations - no tachyarrhythmia is noted on        telemetry.     b. Electrocardiogram consistent with pacer spikes only.  2. History of hypertension - reasonably controlled - needs ongoing follow     up.  3. Coronary artery disease with possible myocardial infarction in '82 per     history.  4. Status post pacemaker placement in '90.  5. Hyperlipidemia.  6. Remote history of agitation with aggressive behavior - none observed     during this hospitalization.   DISCHARGE MEDICATIONS:  1. Enteric coated aspirin 81 mg p.o. daily.  2. Vasotec 10 mg p.o. b.i.d.  3. Colace 100 mg p.o. daily.  4. Lopressor 25 mg p.o. b.i.d.  5. Vioxx 12.5 mg p.o. daily until resolution of chest pain then discontinue.  6. Ambien 5 to 10 mg p.o. q.h.s. p.r.n.  7. Catapres 0.1 mg p.o. q.8h p.r.n. systolic blood pressure greater than     160.   FOLLOW UP:  Follow up will be provided by the attending of record at the  patient's nursing home.  The patient should be evaluated in approximately 10-  14 days for recheck of heart rhythm, blood pressure and general condition.   CONSULTATIONS:  None.   PROCEDURE:  None.   HISTORY OF PRESENT ILLNESS:  Ms. Santino is a pleasant 77 year old female  who was admitted to the hospital on 08/26/02 after she complained of the  acute onset of racing heart.  This followed by a sense of questionable  pressure versus sharp pain  in the left side of her chest.  This all lasted  approximately 30 minutes and then resolved spontaneously.  Questionably  after taking one nitroglycerin.   HOSPITAL COURSE BY ACTIVE PROBLEM:  PROBLEM 1.  Chest pain - patient gave  report of chest pain that was questionably associated with palpitations.  This was a difficult history to obtain and therefore the exact events are  not entirely reliable.  Nevertheless, during this hospitalization a set of  cardiac enzymes was negative.  EKG was unremarkable as far as revealing  signs of acute myocardial infarction.  The patient was monitored on  telemetry and telemetry was unremarkable during this hospitalization as  well.  Ms. Spina was not cooperative with all laboratory values and  therefore a complete complement of cardiac enzymes was not able to be  obtained.  Nonetheless, two full sets were unremarkable with normal  troponins and insignificantly elevated creatinine kinase.  This and the  history were not consistent with unstable angina and therefore no further  evaluation was felt to be appropriate at this time.  In fact, the patient's  chest pain was able to easily be reproduced on palpation of the rib cage on  the left.  There was point tenderness approximately on intercostal space  inferior to the nipple on this chest wall.  With the initiation of Vioxx the  patient's pain resolved.  She will complete Vioxx for approximately 7-10  days or until her pain has resolved.  PROBLEM 2.  Questionable palpitations - patient has a pacemaker.  The exact  reason that this was placed is not clear.  Nonetheless, during this  hospitalization it was clear on the EKG that it was functioning properly  averaging 60 beats per minute.  The patient was placed on beta blocker at  the time of hospitalization and tolerated this well.  This will be continued  at this time.  Routine follow up with Dr. Garnette Scheuermann the patient's  cardiologist is suggested but  there is no evidence at this time of acute  pacer failure.  Should symptoms resume she should be referred to Dr. Katrinka Blazing  in short order.  PROBLEM 3.  Hypertension - for the most part the patient's blood pressure  was well controlled during the hospitalization.  It is exquisitely sensitive  to agitation.  The patient is treated with Clonidine on a p.r.n. basis and  this could be continued as needed.  PROBLEM 4.  Remote history of agitation - during this hospitalization the  patient was very pleasant and easy to get along with.  There was no  agitation that could be appreciated.  The patient was stable from this stand-  point at the time of discharge and no medications were needed regarding this  diagnosis.                                                Lonia Blood, M.D.    JTM/MEDQ  D:  08/28/2002  T:  08/28/2002  Job:  [id number]

## 2010-09-15 NOTE — Op Note (Signed)
[name], [name]                 ACCOUNT NO.:  [id number]   MEDICAL RECORD NO.:  [id number]          PATIENT TYPE:  OIB   LOCATION:  2899                         FACILITY:  [facility]   PHYSICIAN:  Francisca December, M.D.  DATE OF BIRTH:  11-07-1915   DATE OF PROCEDURE:  02/08/2004  DATE OF DISCHARGE:                                 OPERATIVE REPORT   PROCEDURE:  Upgrade of permanent pacemaker, single chamber to dual chamber  with removal and testing.  Right subclavian venogram.   INDICATIONS FOR PROCEDURE:  The patient is an 77 year old woman who is now  12 years status post insertion of a single chamber [employer] permanent  pacemaker for sick sinus syndrome.  She has now reached elective replacement  interval on her battery.  She is brought now to catheterization laboratory  for battery replacement and upgrade of the device to dual chamber.   DESCRIPTION OF PROCEDURE:  The patient was brought to the cardiac  catheterization laboratory in the fasting state.  The right prepectoral  region was prepped and draped in the usual sterile fashion.  Local  anesthesia was obtained with the infiltration of 1% lidocaine throughout the  right prepectoral region especially over the previously implanted pacemaker.  A right subclavian venogram was then performed utilizing the peripheral  injection of 20 mL of Omnipaque.  A digital cine angiogram was obtained in  the AP projection and road mapped to guide future right subclavian  puncture.  The venogram did demonstrate the vein to be widely patent and  coursing in a normal fashion over the anterior surface of the first rib and  beneath the middle third of the clavicle.   A 6 to 7 cm incision was then made over the previous pacemaker insertion  site and this was carried down by sharp dissection to the pacemaker capsule.  The capsule was incised and the old pacemaker delivered.  It was  disconnected from the ventricular lead.  The patient had her own  intrinsic  rhythm sinus at 75 beats per minute.  The right subclavian vein was then  punctured using an 18 gauge thin wall needle through which was passed a  0.038 inch tight J guide wire.  Over this guide wire a 9 French tearaway  sheath and dilator were advanced.  The dilator was removed and the wires  were allowed to remain in place.  The atrial lead was advanced to the level  of the right atrium.  The sheath was torn away.  Using standard technique  and fluoroscopic landmarks, the lead was manipulated into the right atrial  appendage.  There adequate pacing parameters were obtained as will be noted  below.  The lead was tested for diaphragmatic pacing at 10 volts and none  was found.  The lead was then sutured into place using three separate 0 silk  ligatures.   In suturing in the atrial lead, I found it necessary to elongate the initial  incision.  Using the scalpel, this was done about 1 cm.  The ventricular  lead previously placed was unseen and subsequently  damaged with the scalpel.  I used medical silicon and a sleeve to repair this with 2-0 silk ligatures  at each end of the sleeve.  The lead was then tested for adequate pacing  parameters and this is noted below.  The atrial and ventricular leads were  then connected to the pacing generator, carefully identifying each by its  serial number and placing each in the appropriate receptacle.  They were  carefully tightened into place and tested for security.  The atrial lead was  wound beneath the pacing generator and the generator was placed in the  pocket, the pocket did require some enlargement with blunt dissection prior  to it adequately fitting within.  Prior to placing the pacemaker in the  pocket, it was copiously irrigated using 1% Kanamycin solution.  The wound  was then closed using 2-0 Vicryl in a running fashion for the subcutaneous  layers.  The skin was approximated using 4-0 Vicryl in a running  subcuticular fashion.   Steri-Strips and a sterile dressing were applied.  The patient was transported to the recovery room in A sense V pace mode.   EQUIPMENT DATA:  The old pacing generator was a [employer] model [id number]-  [id number], serial [id number].  The new pacing generator is a Medtronic Enpulse model  B9779027, serial [id number].  The atrial lead is a Medtronic model U9629235,  serial [id number].   PACING DATA:  The ventricular lead detected a 15.7 millivolt R wave.  The  pacing threshold was 0.8 volts at 0.5 millisecond pulsewidth.  The impedance  was 452 ohms resulting in a current capture threshold of 1.2 MA.  The atrial  lead detected a 3.5 millivolt P wave.  The impedance was 663 ohms.  The  pacing threshold was 1.3 volts at 0.5 millisecond pulsewidth resulting in a  current capture threshold of 2.5 MA.       JHE/MEDQ  D:  02/08/2004  T:  02/09/2004  Job:  [id number]

## 2010-09-15 NOTE — H&P (Signed)
NAME:  [name], [name]                           ACCOUNT NO.:  [id number]   MEDICAL RECORD NO.:  [id number]                   PATIENT TYPE:  EMS   LOCATION:  MAJO                                 FACILITY:  [facility]   PHYSICIAN:  [name], Dr.                  DATE OF BIRTH:  [birth year]   DATE OF ADMISSION:  08/04/2003  DATE OF DISCHARGE:                                HISTORY & PHYSICAL   ADMISSION DIAGNOSES:  1. Decreased level of consciousness.  2. Urinary tract infection with probable sepsis.  3. Failure to thrive, anorexia.  4. Necrotic sacral ulcer.  5. Dry gangrene bilateral heels.  6. Anemia (hemoglobin 10.8 on August 01, 2003).  7. Peripheral vascular disease with severely decreased ankle-brachial     indexes.  8. Hypertension.  9. Mild renal insufficiency, BUN and creatinine 27 and 1.5 respectively, on     August 01, 2003.  10.      Dementia.  11.      Permanent transvenous pacemaker, interrogated January 2005, normal     function.  12.      Coronary artery disease.  13.      Myocardial infarction in 1982.   ALLERGIES:  NKDA.   Full code.   HISTORY OF PRESENT ILLNESS:  This is an 77 year old African American female  with multiple medical problems, who has had steady decline in overall health  for the last couple of months.  Last hospitalization, [facility]  from June 11, 2003 to June 15, 2003 for suspected sepsis from  necrotic ulcers and failure to thrive.  The patient became increasingly  lethargic, on August 01, 2003, prompting a visit to [facility] ED.  She was found to have Gram negative rods UTI and a head CT was performed  which showed a 7-mm osteoma or meningioma, otherwise no acute abnormality,  generalized atrophy.  The patient was sent back to the facility.  The  patient has continued to decline over the last couple of days.  Not eating  or drinking.  It was found that the patient was never given a formal  prescription from the  emergency room for antibiotic therapy (per the DON who  called the emergency room today).  So the patient has been without  antibiotic therapy since diagnosed with UTI, on August 01, 2003.  Also, family  told by someone that [name] has a brain tumor.  The family has talked  amongst themselves and does not want aggressive treatment of the tumor but  are requesting feeding tube placement.  Now the patient is lethargic,  minimally responsive, and did not even move when __________ treatments were  performed to sacral wound this evening.  (The patient was not pre-  medicated).  I spoke with Dr. Leanord Hawking, and he requested the patient be sent  out to the hospital.  The family to be  notified.   MEDICATIONS:  1. Aspirin 81 mg.  2. __________ 10 mg.  3. Plavix 75 mg a day.  4. Prilosec 20 mg p.o. every day.  5. Zinc sulfate 220 mg a day.  6. Clonidine 0.1 mg p.o. b.i.d.  7. Trinsicon one p.o. b.i.d.  8. Geodon 80 mg b.i.d.  9. Marinol 2.5 mg p.o. b.i.d.  10.      Lopressor 50 mg b.i.d.  11.      Vitamin C 500 mg p.o. b.i.d.  12.      Multivitamin daily.  13.      Senokot two q.h.s.  14.      __________ 64/60 ml p.o. Blackburn.i.d.  15.      Ensure pudding Blackburn.i.d.   PAST FAMILY AND SOCIAL HISTORY:  Relatively insignificant.  The patient is a  resident at [place].  Responsible party is [name] (son).  Home number [phone].  Cell number [id number].   REVIEW OF SYSTEMS:  Unobtainable.   PHYSICAL EXAMINATION:  VITAL SIGNS:  Temp 98, blood pressure 138/92, pulse  78, respirations 18.  GENERAL:  The patient is lethargic, minimally responsive, pale, and  cachectic.  HEENT:  Lesion on left outer lip.  Oropharynx:  Mucous membranes dry.  Teeth  in poor repair.  LUNGS:  Decreased inspiratory effort.  No adventitious breath sounds  heard.  COR:  A 1/6 systolic murmur.  Regular, rate and rhythm.  ABDOMEN:  Soft, nontender.  EXTREMITIES:  Heels and the sacrum are bandaged.  Extremities are  without  edema.   LABS:  On August 01, 2003:  WBC 11.7, hemoglobin 10.8, platelets 291.  Sodium  142, potassium 4.2, BUN 27, creatinine 1.5, and glucose 105.  Cardiac  enzymes negative.   ASSESSMENT/PLAN:  1. Lethargy/decreased level of consciousness/failure to thrive.  We will     admit.  Suspect urinary tract infection sepsis and dehydration.  2. Anorexia.  Family wanting feeding tube.  This will need to be discussed.  3. A 7-mm meningioma, osteoma on head CT on August 01, 2003.  This will need     to be compared to other CT scans.  Family believes the patient has a     brain tumor and this needs to be clarified.  4. Sacral ulcer/heel gangrene.  Wound care nurse will need to follow in the     hospital and for management of chronic medical conditions.  5. The patient remains a full code.      Georgiann Cocker Jernejcic, P.A.                   Carlisle Beers Blackburn, Dr.    Clovis Cao  D:  08/04/2003  Blackburn:  08/05/2003  Job:  [id number]

## 2010-09-15 NOTE — Op Note (Signed)
[name], [name]                           ACCOUNT NO.:  [id number]   MEDICAL RECORD NO.:  [id number]                   PATIENT TYPE:  INP   LOCATION:  5154                                 FACILITY:  [facility]   PHYSICIAN:  Georgiana Spinner, M.D.                 DATE OF BIRTH:  1915/06/21   DATE OF PROCEDURE:  08/13/2003  DATE OF DISCHARGE:                                 OPERATIVE REPORT   PROCEDURE:  Percutaneous endoscopic gastrostomy tube placement.   ANESTHESIA:  Demerol 30 mg, Versed 3 mg.   INDICATIONS:  Inability to take adequate nourishment.   PROCEDURE:  With the patient mildly sedated in the recumbent position, the  Olympus videoscopic colonoscope was inserted in the mouth, passed under  direct vision into the esophagus, which appeared normal, into the stomach.  The fundus, body, antrum, duodenal bulb, and second portion of the duodenum  were visualized, photographs were taken, and all appeared normal.  From this  point the endoscope was withdrawn taking circumferential views of the  duodenal mucosa until the endoscope had been placed back into the stomach,  placed in retroflexion to view the stomach from below, and a mild hiatal  hernia was seen and photographed.  The endoscope was then straightened and  pulled back from the distal to proximal stomach taking circumferential views  of the gastric mucosa and allowing transillumination to occur on the  abdominal wall. Once the transillumination was seen to occur in the left  upper quadrant and palpation of the abdomen with a finger could be seen to  indent the stomach endoscopically, we then used that spot as our position  for the PEG placement.  The skin was then subsequently prepped and draped in  the usual sterile fashion.  The Xylocaine was injected into the skin to make  a wheal, and in this area I made a short horizontal incision.  Through this  was advanced the trocar and was seen to enter directly into the stomach  endoscopically.  Through the trocar was passed a guidewire, which was  grasped with the snare through the endoscope and pulled out through the  mouth.  To this was attached the [place] 24 [place] pull PEG tube, which  was pulled rather easily and placed in position 4 cm removed from the bumper  and while the skin was being dressed in the usual sterile fashion, the  endoscope was reinserted into the mouth, passed into the esophagus and back  into the stomach, and the bumper was seen to be in the correct position in  the stomach.  This was photographed.  The endoscope was withdrawn.  The  patient's vital signs and pulse oximetry remained stable.  The patient  tolerated the procedure well without apparent complication.   FINDINGS:  Unremarkable endoscopic examination with percutaneous endoscopic  gastrostomy tube placement.   PLAN:  Begin using PEG tube.  Georgiana Spinner, M.D.    GMO/MEDQ  D:  08/13/2003  T:  08/16/2003  Job:  [id number]

## 2010-09-15 NOTE — Discharge Summary (Signed)
[name], Blackburn                 ACCOUNT NO.:  [id number]   MEDICAL RECORD NO.:  [id number]          PATIENT TYPE:  OIB   LOCATION:  3732                         FACILITY:  [facility]   PHYSICIAN:  [name], M.D.  DATE OF BIRTH:  09/11/1915   DATE OF ADMISSION:  02/08/2004  DATE OF DISCHARGE:  02/09/2004                                 DISCHARGE SUMMARY   PRIMARY CARDIOLOGIST:  [name], M.D.   PRIMARY CARE PHYSICIAN:  [name] at [facility].   DISCHARGING PHYSICIAN:  [name], M.D.   HISTORY OF PRESENT ILLNESS AND REASON FOR ADMISSION:  [name] is an 77-  year-old female, resident of [place] who was admitted to [facility] after a pacemaker generator change out due to battery end of life.  The patient has a known history of sick sinus syndrome and is status post  previous CVI, pacemaker in the past.   HOSPITAL COURSE:  Pacemaker change out.  Again, the patient was admitted on  February 08, 2004.  There were initial plans that the patient would come in  as an outpatient and leave postprocedure.  She did undergo removal of her  old pacing generator with insertion of a new generator with insertion of an  atrial lead up grade to a dual chamber.  In addition, the ventricular lead  was damaged and was subsequently repaired per Dr. Amil Blackburn.  Please see his  dictated note regarding the pacemaker change out procedure.  The patient has  had impulse generator [id number], serial #[id number].  Atrial lead is a  Medtronic capture fix serial #[id number], RV lead is an Intramedics Model  430-07, serial [id number].  Date of initial insertion was July 31, 1991.  Postprocedure the patient has done well.  Postprocedure chest x-ray shows no  pneumothorax.  The wound is clean, dry, and intact; and, otherwise, the  patient has been deemed appropriate for discharge.   The patient also has chronic problems related to hypertension.  In the past  has had hypotension due to over medication.  Blood pressure today, at  discharge, was 148/100, this is similar to blood pressures seen in the  office during initial workup; and, in the past Dr. Katrinka Blackburn has deferred blood  pressure management to [name] and we will do this today.   Also patient has history of bilateral wound decubiti, presented with a  stable left heel decubiti upon admission.  This ulcer, again, is clean, dry,  and intact; and healing and granulating in well.  The wound care team has  also assessed her for any progression of problems.  Right heel is intact and  sacrum is intact and no additional recommendations, other than to continue  current wound care as prior to admission.   FINAL DISCHARGE DIAGNOSES:  1.  Pacemaker generator change out due to low voltage issues.  2.  Status post upgrade to dual chamber pacer and repair of ventricular lead      damage.  3.  Hypertension, currently uncontrolled.  4.  History of sick sinus syndrome  requiring initial pacemaker placement in      1993.  5.  History of anorexia and dysphagia status post PEG-tube with continuous      tube feedings.  6.  Left heel decubiti, stable.  7.  History of incontinence of bowel and bladder.   DISCHARGE MEDICATIONS:  1.  Multiple vitamin with minerals daily.  2.  Plavix 75 mg daily.  3.  Aspirin 81 mg daily.  4.  Zoloft 25 mg daily.  5.  Prevacid 30 mg daily.  6.  Centrum Silver with vitamin C 500 mg daily.  7.  Zinc 220 mg daily.  8.  ProMod powder 3 scoops, down PEG-tube daily.  9.  Morphine sulfate 2 mg daily.  10. Catapres 0.1 mg b.i.d.  11. Trinsicon 1 b.i.d.  12. __________  64 liquid, 30 cc by mouth/daily.  13. Hydrocodone with Tylenol 5/500 one q.6h. around-the-clock.  14. Senokot 2 every night at bedtime.  15. Free water 250 cc q.i.d. down the PEG-tube.  16. Jevity 1.5, 75 cc an hour down the PEG-tube continuous tube feedings.  17. Mobic 7.5 mg daily p.r.n. arthritic pain.  18.  Morphine sulfate 10 mg 1 q.4h. p.r.n. pain.   ACTIVITY:  As previous.  The patient is usually mobile with a wheelchair and  assistance out of bed.   DIET:  Jevity 1.5, as previously listed, at 75 cc an hour.   WOUND CARE:  Continue prior dressings and wound care to the left heel  decubitus.  Please refer to the pacemaker discharge instruction sheet  regarding wound care for the pacemaker site.   FOLLOWUP APPOINTMENTS:  She has an appointment to see Dr. Amil Blackburn scheduled  for October 28 at 1 p.m.       ALE/MEDQ  D:  02/09/2004  T:  02/09/2004  Job:  [id number]   cc:   [name] III, M.D.  [address]  [address]  [place]  [place] [address]  Fax: [phone]

## 2010-09-15 NOTE — Discharge Summary (Signed)
NAME:  [name], [name]                           ACCOUNT NO.:  [id number]   MEDICAL RECORD NO.:  [id number]                   PATIENT TYPE:  INP   LOCATION:  4743                                 FACILITY:  [facility]   PHYSICIAN:  Rosanne Sack, M.D.         DATE OF BIRTH:  06-Dec-1915   DATE OF ADMISSION:  06/11/2003  DATE OF DISCHARGE:  06/15/2003                                 DISCHARGE SUMMARY   CHIEF COMPLAINT:  Altered level of consciousness, sacral ulcer.   DISCHARGE DIAGNOSES:  1. Sacral decubitus ulcer, no obvious indication of active infection.     a. Leukocytosis improved with current antibiotic therapy, day five of 14.        Most likely source leukocytosis, chronic left heel osteomyelitis as        per January 2005 x-ray indicating osteomyelitis with possible        superimposed calcaneal fracture.     b. Wound care team consult regarding unstaged sacral ulcer with an 80%        necrotic slough with no odor or exudate. Unstagable currently.        Recommendation Accuzyme enzymatic debridement with Bailey gauze        overlay dressing change. Facility wound care at the nursing home        should follow this site daily and turn the patient right to left. We        also encourage utilization of pressure mattress overlay.  2. Failure to thrive with hypoalbuminemia, likely multifactorial secondary     to dementia, chronic osteomyelitis, multiple other comorbidities.     a. Nutritionalist consult obtained over the course of the        hospitalization. Recommended continuation of current diet (__________)        with increased calorie presentation per dining trays and Ensure        pudding supplement three times daily between meals and at bedtime.     b. Will need close supervision and documentation of oral intake, food and        fluids. Would follow with at least once weekly weights to monitor for        failure to thrive. would follow-up a serum prealbumin two to three     weeks post discharge. I have encouraged the family to participate with        meal intake to maximize the patient's nutrition. Would certainly        continue to encourage their participation post discharge in the        nursing home.     c. I have added Marinol twice daily as an appetite stimulant at the time        of discharge. Megace was avoided due to history of likely TIAs and        peripheral vascular disease. Would titrate dosage as per affect post  discharge.  3. Altered mental status thought secondary to baseline dementia and     underlying osteomyelitis.     a. History of recurrent unresponsiveness, etiology unclear, question TIA        secondary to hypertension versus unknown etiology.     b. CT scan brain May 18, 2002 noted atrophy and small vessel disease        with no acute findings. MRI/MRA not possible due to previous pacemaker        implant.     c. EEG study May 17, 2003 noted mild diffuse background slowing. This        may be primarily degenerative in nature secondary to dementia or a        variety of toxic or metabolic etiologies.     d. Carotid Doppler study May 17, 2003 noted right side 60-80%        internal carotid stenosis and left side no indication of carotid        artery stenosis. The vertebral artery flow antegrade bilaterally. This        is not thought contributory to the patient's intermittent        unresponsiveness.  4. Telemetry monitoring over the course or hospitalization, no indication of     arrhythmias.     a. Continue aspirin and Plavix prophylaxis post hospitalization.  5. Normocytic anemia, likely chronic disease with component of iron     deficiency anemia.     a. The patient was transfused one unit of packed red blood cells over the        course of hospitalization. Hemoglobin climbing from admission 8.9 to        post transfusion 10.2. At discharge hemoglobin is stable at 10.3.  6. Hypertension.  Stable on current  medications.     a. Previously questioned the presence of possible renal artery stenosis.  7. Chronic renal insufficiency with baseline BUN and creatinine of     approximately 23 and 1.4.     a. Renal function stable over the course of hospitalization status post        IV hydration BUN 8 and creatinine 1.1.     b. Would follow a basic metabolic panel one to two weeks post discharge.        Would follow hemoglobin and hematocrit one to two weeks post        discharge.  8. Bilateral heel dry gangrene right greater than left.     a. Current hospitalization wound care team consult found no indication of        dressings to these sites. Did request floating the heels off of the        bed to avoid pressure.     b. Lower extremity Doppler study May 16, 2002 noted bilateral ABI        demonstrating severe reduction in flow left side worse than right.     c. Left foot and heel x-ray May 17, 2003 notes osteomyelitis with a        possible superimposed calcaneal fracture.  9. CVTS consult January 2005, Dr. Edilia Bo, felt the patient was not a     candidate for revascularization. Felt there was no current need for     amputation, but if gangrene became wet type or the site became infected     would consider amputation.  10.      Suspected chronic osteomyelitis at the left heel site.  11.  History of coronary artery disease.     a. Status post pacemaker implant in the 1990s.     b. History of a myocardial infarction 1982.  12.      History of hyperlipidemia.  13.      History of non-cardiac chest pain thought secondary to     costochondritis April 2004.  14.      History of dyspnea with exertion.  15.      History of dehydration and hypokalemia January 2005.  16.      History of dementia with associated agitative and aggressive     behavior.  17.      No known drug allergies.  18.      Code status is full code per family request. 19.      Lovenox DVT prophylaxis over the course of  this hospitalization.   DISCHARGE MEDICATIONS:  1. Trinsicon 1 tablet twice daily.  2. Geodon 80 mg twice daily.  3. Lopressor 50 mg twice daily.  4. Senokot two tablets daily at bedtime.  5. Vitamin C 500 mg twice daily.  6. Zinc sulfate 220 mg daily.  7. Vasotec 10 mg daily.  8. Clonidine 0.1 mg twice daily. Hold for systolic blood pressure less than     100.  9. Marinol 2.5 mg twice daily.  10.      Aspirin 325 mg daily, enteric-coated.  11.      Multivitamin once daily.  12.      Colace 100 mg twice daily.  13.      Plavix 75 mg once daily.  14.      Protonix 40 mg once daily.  15.      Augmentin 875 mg twice daily for an additional nine days post     discharge.  16.      Ensure pudding three times daily between meals and at bedtime.  17.      Tylenol 650 mg every four hours as needed for mild pain or fever.  18.      Vicodin 5/500 mg one tablet every four hours as needed for moderate     pain.   DISCHARGE INSTRUCTIONS:  1. Returning to [facility], primary care Dr. Baltazar Najjar.  2. Activity:  Up to chair as tolerated with full assistance.  3. Diet:  Unrestricted due to current failure to thrive.  4. Heel should be floated off the mattress to avoid pressure.  5. Repeat hemoglobin, hematocrit, and basic metabolic panel one to two weeks     post discharge.  6. Routine pulse and blood pressure checks post discharge.  7. Would closely monitor oral intake, food, and fluids. Would follow weekly     weights to monitor for failure to thrive.  8. Wound care to sacral ulcer should be Accuzyme to debride with gauze     dressing once daily. Once site is debrided may discontinue Accuzyme and     proceed with wound care per facility protocol. The patient should be     turned to left or right side every two hours. Suggest an air mattress     overlay if available to reduce pressure. I have continued Foley catheter     at the time of discharge to aid wound care  healing of sacral ulcer.     Continuation of this catheter per the discretion of the primary care     physician.   CONSULTATIONS OVER THE COURSE OF  HOSPITALIZATION:  1. Wound care team.  2. Nutritionalist consult.   PROCEDURES PERFORMED:  Transfusion one unit packed red blood cells.   DISCHARGE LABS:  Metabolic panel June 15, 2003:  Sodium 141, potassium  3.9, chloride 108, CO2 27, BUN 8, creatinine 1.1. CBC June 15, 2003:  WBC 10.5, hemoglobin 10.3, hematocrit 31.2, platelets 314. A swab culture of  sacral wound produced multiple organisms, likely contaminants. Serum TSH  0.655. Blood cultures produced no growth over the course of the  hospitalization. Urine culture produced no growth over the course of the  hospitalization. Chest x-ray June 01, 2003 with stable chest with no  active disease.   HISTORY OF PRESENT ILLNESS:  An 77 year old female resident of [facility], followed there in primary care by Dr. Baltazar Najjar.  The patient has had progressive dry gangrene to the heels bilaterally,  previously evaluated by Dr. Edilia Bo, CVTS. She was found to have a sacral  ulcer by the nursing staff at the [facility]. This was  evaluated by the facility wound care nurse and she thought there was some  central necrosis and some periwound erythema. They described the patient  febrile, but she was not found so with her transport to the emergency room.  She was noted to have leukocytosis of 12.8 per nursing home labs, also a  hemoglobin of 8.9. The patient was admitted for further evaluation and  treatment. For dictated admission medications, family history, social  history, review of systems, physical exam, laboratory data, impression and  plan, please see the dictated admission history and physical daily June 01, 2003.   HOSPITAL COURSE:  1. Sacral decubitus ulcer. The patient presented with a sacral ulcer as     above. She had no fever  since her presentation in the emergency room, but     did have noted leukocytosis as above. Was empirically started on IV     antibiotic therapy with Zosyn. A wound team consult was obtained over the     course of the hospitalization. They noted no indication of exudate or     periwound changes. They recommended Accuzyme to the site to debride this     currently unstagable wound. Also pressure relief with turning left to     right. I would also utilize a pressure mattress overlay if available. The     patient should be turned left to right every two hours. Following     debridement as described above would proceed with wound care treatment     per facility protocol. It is thought that the patient's most likely     explanation for her leukocytosis is chronic left heel osteomyelitis as     noted per x-ray on May 17, 2003. With antibiotic therapy her     leukocytosis has decreased to 10.5. She is on day five of 14 antibiotic     therapy switched from Zosyn to oral Augmentin. At some point the patient     may require an amputation per previous CVTS consult, Dr. Edilia Bo, but     only if this should change to wet gangrene or demonstrate signs of active     infection. Per the wound care consult the heel sites do not currently     require a dressing, but do require floating the heels off of the bed     surface to avoid pressure. All of her wounds will require frequent     facility monitoring at the nursing  home.  2. Failure to thrive and hypoalbuminemia. This thought multifactorial due to     her baseline dementia, chronic osteomyelitis, multiple other     comorbidities. Her oral intake was quite poor at admission, but has     improved over the past 24 hours. Her albumin was low at 2.3. She did have     a nutritionalist consult recommending increased kilocalories to the meal     trays. I have discontinued any dietary restrictions such as low-fat, low-    cholesterol, low-salt in hopes to  encourage oral intake. I believe the     failure to thrive and development of sacral ulcer are more concerning     than her previous history of coronary artery disease. I added Marinol as     an appetite stimulant and would follow oral intake, food, and fluids     closely post discharge. Would titrate the dose based on the effect. Would     supplement as above. I have also discussed with the family the importance     of her improved oral intake. They participated with her meal intake over     the course of the hospitalization and I would certainly encourage their     further participation post discharge at the nursing home.  3. Altered mental status/dementia. The patient was noted lethargic on     presentation. This is likely multifactorial due to her chronic     osteomyelitis, failure to thrive, baseline dementia, and multiple     comorbidities. This was not aggressively worked up over the course of     this hospitalization and her level of consciousness quickly returned to     baseline. She did have a full workup for recurrent periods of     unresponsiveness during hospitalization May 17, 2003 through May 21, 2003 as above.  4. Normocytic anemia. The patient does have a normocytic anemia, likely a     combination chronic disease and component iron deficiency anemia. She is     on routine iron supplementation. Despite this, her hemoglobin decreased     from baseline mid 10 range to 3.9 on admission. She was transfused on     unit of packed red blood cells. Post transfusion hemoglobin climbed to     10.2 and this remained stable over the remainder of the hospitalization,     at discharge 10.3. Would follow with a repeat hemoglobin and hematocrit     one to two weeks post discharge.  5. Hypertension.  This is currently uncontrolled during the hospitalization     May 17, 2003 through May 21, 2003. Currently her blood pressures     are well-controlled on her  antihypertensive medication regime. Would     follow blood pressures and pulse routinely post discharge at the nursing     home.  6. Chronic renal insufficiency.  The patient has a known history of chronic     renal insufficiency. Baseline BUN 23 and creatinine 1.4. She was     initially hydrated with IV fluids and her renal function remained stable     over the course of the hospitalization. Discharge BUN 8 and creatinine     1.1. Will follow with a basic metabolic panel one to two weeks post     discharge. Will aggressively encourage oral intake of fluids.  7. Bilateral heel dry gangrene, left greater than right. Suspected chronic     osteomyelitis left heel. Treatment  and impressions as above under #1.  8. History of coronary artery disease status post pacemaker implant. The     patient has a known history of MI in 43. She had no complaints of chest     pain over the course of this hospitalization. She remained stable with a     paced rhythm per telemetry. 9. Code status. The patient is full code per family wishes, this despite her     multiple comorbidities.   DISPOSITION:  Discharged back to [facility], primary care  doctor, Dr. Baltazar Najjar. Activity up as tolerated to chair.   CONDITION ON DISCHARGE:  Stable/improved, though frail with multiple medical  problems as above.   Discharge process greater than 30 minutes 11 o'clock a.m. through 11:45 a.m.      Everett Graff, N.P.                     Rosanne Sack, M.D.    TC/MEDQ  D:  06/15/2003  Blackburn:  06/15/2003  Job:  [id number]

## 2010-09-15 NOTE — Consult Note (Signed)
NAMEDEBROH, SIELOFF                           ACCOUNT NO.:  [id number]   MEDICAL RECORD NO.:  [id number]                   PATIENT TYPE:  INP   LOCATION:  5154                                 FACILITY:  [facility]   PHYSICIAN:  Georgiana Spinner, M.D.                 DATE OF BIRTH:  Dec 25, 1915   DATE OF CONSULTATION:  08/12/2003  DATE OF DISCHARGE:                                   CONSULTATION   REASON FOR CONSULTATION:  Ms. Sides is an 77 year old lady who I have been  asked to see for a feeding tube.   HISTORY OF PRESENT ILLNESS:  She was admitted for urinary tract infection  and probable sepsis August 04, 2003.  She has had anorexia and failure to  thrive.  She had a necrotic sacral ulcer with gangrene on both heels,  chronic anemia, history of peripheral vascular disease, hypertension, and  mild renal insufficiency, dementia.  She has had a pacemaker with coronary  artery disease documented by a myocardial infarction in 1982.  She has no  known drug allergies.  She was a full code patient.   She has had a recent steady decline in overall health for the last two  months prior to admission.  She had hospitalization earlier this year for  sepsis, failure to thrive, and the patient had not been eating or drinking  in the nursing home.   MEDICATIONS:  On admission, she was on aspirin, Plavix, Prilosec, zinc,  Clonidine, and other medications as listed on the history and physical.   FAMILY HISTORY:  Not considered significant.   SOCIAL HISTORY:  Not considered significant.   REVIEW OF SYMPTOMS:  Unobtainable by me as well.   PHYSICAL EXAMINATION:  VITAL SIGNS:  Temperature is 97.9, pulse 94,  respiratory rate 18 with a 95% O2 saturation on room air.  Blood pressure  105/50.  HEENT:  Unremarkable.  NECK:  Thyroid not enlarged.  No bruits in the neck.  LUNGS:  Clear.  CARDIOVASCULAR:  Regular rhythm without murmurs, rubs, or gallops.  ABDOMEN:  Soft without masses, tenderness, or  organomegaly.  Her abdomen has  quite bit of a pannus.   IMPRESSION:  The patient has not been able to eat here.  She had a  swallowing study which showed no mechanical problems to swallowing but the  patient is just not initiating the feedings.  The family wants a feeding  tube placed because she does still interact with her family and this appears  to be fairly reasonable to Dr. Mayford Knife.  I have not met with the family or  seen the patient interact but believe their opinion.  We will plan on doing  a percutaneous endoscopic gastrostomy tube placement.  We will explain to  the family the procedure, risks and benefits, rationale, and alternatives.  Hopefully, they will be willing to proceed with this lady.  Georgiana Spinner, M.D.   GMO/MEDQ  D:  08/12/2003  T:  08/12/2003  Job:  [id number]

## 2010-09-15 NOTE — H&P (Signed)
NAME:  [name], [name]                           ACCOUNT NO.:  [id number]   MEDICAL RECORD NO.:  [id number]                   PATIENT TYPE:  INP   LOCATION:  4743                                 FACILITY:  [facility]   PHYSICIAN:  Tara C. Jernejcic, P.A.             DATE OF BIRTH:  09-17-1915   DATE OF ADMISSION:  06/11/2003  DATE OF DISCHARGE:                                HISTORY & PHYSICAL   ADMITTING PHYSICIAN:  Maxwell Caul, M.D./Juan-Carlos Monguilod, M.D.   [facility] [phone].   FACILITY:  [facility].   ADMISSION DIAGNOSES:  1. Sepsis--likely related to necrotic sacral ulcer.  2. Altered mental status--probably secondary to #1.     a. History of dementia.  3. Necrotic sacral ulcer.     a. Has developed over the last 4-5 days.  4. Anemia, normocytic, normochromic.     a. 8.9 on June 11, 2003.     b. Heme-negative occult blood.  5. Dry gangrene bilateral heels.     a. Poor ankle brachial indices (ABIs) suggesting severe reduction of        blood flow/peripheral vascular disease done January 2005.  6. Vascular surgery consult--Dr. Edilia Bo who has felt that she is not a     revascularization candidate.  Would continue to treat conservatively     unless gangrene became wet.  7. Dementia.  8. Hypertension--recently has had some hypotensive episodes and medication     adjustments have been made.  9. History of recurrent unresponsive cells.     a. Head CT showing atrophy versus small vessel disease May 05, 2003.     b. Right internal carotid artery stenosis of 60-80% felt not to be        contributing.  Antegrade vertebral flow bilaterally.     c. EEG shows generalized diffuse background slowing.     d. MRI not performed due to pacemaker.  10.      Permanent transvenous pacemaker placed in 1990--recent     interrogation at Dr. Michaelle Copas office in January 2005 showed normal pacer     function.  11.      Reported myocardial infarction in 1982.  12.       Mild renal insufficiency.   ALLERGIES:  No known drug allergies.   CODE STATUS:  Full.   HISTORY OF PRESENT ILLNESS:  This patient is an 77 year old African-American  female with the aforementioned medical problems.  She has had progressive  dry gangrene of the bilateral heels which has been evaluated by Dr.  Waverly Ferrari and is being conservatively at present.  On Sunday she  was found to have a sacral ulcer by the nursing staff during her bath.  She  was evaluated on Monday by the wound treatment nurse who felt that she had a  very small area of central necrosis and significant periwound erythema.  The  patient was afebrile and  normotensive.  On June 10, 2003 wound rounds  revealed progression of the sacral ulcer with increasing necrosis and foul  odor.  CBC and BMETs were ordered.   On June 11, 2003 blood work reveals a white blood cell count of 12.8,  hemoglobin 8.9, hematocrit 26.7, and platelet count of 309.  BUN 22,  creatinine 1.4.  Sodium 139, potassium 4.4, chloride 104, and carbon dioxide  25.  The sacral wound continues to get larger and now has a small opening at  the superior most portion which may show evidence of early tunneling.  There  is significant induration surrounding the wound.   Conversation was held, over the phone, with POA Melton Krebs (son) who  wants aggressive inpatient hospitalization treatment.  She is still a full  code.   CURRENT MEDICATIONS:  1. Aspirin 325 mg a day.  2. Multivitamin.  3. Colace 100 mg a day.  4. Plavix 75 mg a day.  5. Prilosec 20 mg a day.  6. Zinc sulfate 220 mg a day.  7. Trinsicon 1 p.o. Blackburn.i.d.  8. Geodon 80 mg b.i.d.  9. Lopressor 50 mg b.i.d.  10.      Vitamin C 500 mg b.i.d.  11.      Senokot 2 tablets q.h.s.  12.      Arginate 1 pack p.o. b.i.d.  13.      Vasotec 10 mg a day.  14.      Clonidine 0.1 mg b.i.d.   DIET:  Mechanical soft, no added salt.   FAMILY HISTORY:  Noncontributory given the  patient's advanced age.   SOCIAL HISTORY:  The patient resides at [facility]. She is a widow.  She has 9 children.  Quit smoking in the 1970s.  Remote history of heavy  alcohol use.  She has had a slow decline in her mental and physical function  in the last couple of weeks.   PHYSICAL EXAMINATION:  VITAL SIGNS:  Temperature 97.3, blood pressure  140/60, pulse 86, respirations 23.  Most recent weight was 147.1 on June 09, 2003.  HEENT:  Normocephalic, atraumatic.  Conjunctivae pale.  Pupils are equal,  round, and reactive.  EOMI.  Positive red reflex.  NECK:  Supple without bruits or adenopathy.  LUNGS:  Clear to auscultation.  CARDIAC:  Regular rate and rhythm with a 1/6 systolic murmur, left lower  sternal border.  No rub.  ABDOMEN:  Soft, nontender, obese, no masses appreciated.  GENITOURINARY:  Normal external female genitalia.  RECTAL:  Heme negative stool, darker brown in color.  Extensive sacral ulcer  as described in the history of present illness.  Central necrosis with very  foul odor.  EXTREMITIES:  Minimal lower extremity edema.  She has dry gangrene affecting  the entire left heel.  No erythema or drainage.  The right heel has dry  gangrene as well, but to a lesser extent.  Distal pulses are difficult to  palpate.  No cyanosis or clubbing.  NEUROLOGIC:  The patient is somnolent.  She will awakening briefly and can  tell us if she feels bad, but basically remains asleep.  Unable to assess  strength or sensation at present because of the patient's somnolence.   LABORATORY DATA:  As stated above.   ASSESSMENT:  1. Probable sepsis secondary to necrotic sacral lesion.  2. Progressive dry gangrene feet/heels.  3. Altered mental status/failure to thrive--decreased p.o. intake, weight     loss.  4. Dementia.  5. Hypertension--stable.  PLAN:  1. Would admit the patient to a telemetry bed at [facility]. 2. Will order appropriate lab studies, IV fluid  hydration and antibiotic     therapy.  3. She will need a general surgery consult and wound care and nurse consult.  4. Blood, urine, and wound cultures.  5. Family is aware of the patient's situation and patient will be     transferred to [facility].   Patient has been seen and examined by Dr. Baltazar Najjar.                                                Luan Moore, P.A.    TCJ/MEDQ  D:  06/11/2003  Blackburn:  06/11/2003  Job:  [id number]   cc:   ATTN:  Dr. Daphene Jaeger [facility]

## 2010-09-15 NOTE — Discharge Summary (Signed)
NAME:  [name], [name]                           ACCOUNT NO.:  [id number]   MEDICAL RECORD NO.:  [id number]                   PATIENT TYPE:  INP   LOCATION:  5154                                 FACILITY:  [facility]   PHYSICIAN:  Lonia Blood, M.D.            DATE OF BIRTH:  25-Dec-1915   DATE OF ADMISSION:  08/04/2003  DATE OF DISCHARGE:  08/18/2003                           DISCHARGE SUMMARY - REFERRING   DISCHARGE DIAGNOSES:  1. Pyelonephritis -     a. Infection at initial hospitalization by Citrobacter.     b. Infection at discharge from hospital with Proteus.  2. Severe protein calorie malnutrition with failure to thrive -     a. Percutaneous endoscopic gastroscopy tube initiated.     b. Cutaneous breakdown as noted below.  3. Dysphagia -     a. Severe oral phase dysphagia, thought to be secondary to dementia.     b. Percutaneous endoscopic gastroscopy tube placement per the family's        and patient's request.  4. Necrotic sacral and bilateral heel ulcers -     a. Severe protein calorie malnutrition.     b. Severe dementia.     c. Bedridden state.  5. Chronic anemia with discharge hemoglobin of 8.5.  6. Peripheral vascular disease.  7. Hypertension.  8. Chronic renal insufficiency with baseline creatinine of 1.5.  9. Dementia.  10.      Pacemaker placement.  11.      Coronary artery disease.  12.      Status post myocardial infarction in 1982.  13.      Urinary incontinence, requiring indwelling Foley catheter -     a. Foley catheter discontinued because of severe irritation of the vulva.     b. Probable overlying fungal vulvar involvement.   DISCHARGE MEDICATIONS:  1. Multivitamin, one p.o. daily.  2. Senokot two tab p.o. q.h.s.  3. Plavix 75 mg p.o. daily.  4. Protonix 40 mg p.o. daily.  5. Catapres 0.1 mg p.o. b.i.d.  6. Aspirin 81 mg p.o. daily.  7. Zoloft 25 mg p.o. daily.  8. Diflucan 100 mg p.o. daily x4 days, then stop.  9. Free water via the PEG tube,  200 mL p.o. t.i.d.  10.      Jevity 1.2 calorie tube feed formulation at 60 mL per hour.  11.      Tylenol 650 mg via PEG tube or p.o. q.4-6h. p.r.n.   CONSULTATIONS:  1. Dr. Georgiana Spinner, gastroenterology.  2. Urinary incontinence nursing specialty team.   DIET:  The patient was able to tolerate a full liquid diet without high  concern for aspiration.  This is to be administered as needed for comfort,  in addition to the PEG tube feeds.   WOUND CARE:  Her sacral wound to be covered with a Calci-care/calcium  alginate dressing.  This is to be covered with a 4  x 4.  This 4 x 4 is then  to be covered with OpSite to help prevent penetration of urine.  This is to  be changed daily.  The heels should be floated at all times, to prevent  further breakdown.   PROCEDURE:  1. PEG tube placement per Dr. Virginia Rochester on August 13, 2003.  2. CT scan of the abdomen and pelvis on August 17, 2003:  Normal, with no     evidence of leaking or abscess formation about the PEG tube insertion     site.  3. Speech pathology evaluation - cleared for a full liquid diet on August 11, 2003.   HISTORY OF PRESENT ILLNESS:  The patient is an 77 year old female with  multiple chronic medical problems as noted above, when she was residing in a  local nursing home.  In the days prior to her admission, she began to  develop a decreased level of consciousness.  This was further complicating  her chronic problems of severe malnutrition and severe necrotic sacral  ulceration.  A discussion was had with the family concerning the patient's  altered mental status.  They wished for her to undergo an evaluation at  [facility], and to further address the possible need  for a PEG tube placement during her hospitalization.   HOSPITAL COURSE:  The patient was admitted initially with the diagnosis of  an altered mental status.  A workup including a urinalysis revealed a  Citrobacter pyelonephritis.  The  patient's cultures proved that this  bacteria was susceptible to ciprofloxacin.  She completed the full course of  ciprofloxacin.  She tolerated this well.  Her mental status improved back to  her baseline.  It should be noted that prior to her discharge, she began to  develop mildly elevated fever.  A workup included a repeat urinalysis which  revealed another pyelonephritis.  This time the culture was significant for  Proteus, with susceptibilities pending at the time of discharge.  The  patient had clinically improved significantly with the ciprofloxacin therapy  on an empiric basis.  The patient previously had a chronic indwelling Foley  catheter.  She is likely to be prone to ongoing and recurrent urinary tract  infections.  The Foley had to be discontinued during this hospitalization  because of severe irritation to the meatus, as well as the vulva.  There was  also evidence of overlying fungal infection.  The patient was treated for  this with Diflucan, but because of severe and extreme irritation, the Foley  had to be removed.  The patient's family requested that she be allowed to  wear diapers.  Counseling was carried out by the wound skin care nurse as  well as by this M.D., to explain to the patient's family that diapers could  exacerbate the patient's necrotic ulcers by holding urine and moisture  against them.  They voiced understanding, but did desire to continue to use  diapers.  At the time of discharge there is no evidence of active  pyelonephritis.  She is discharged on ciprofloxin 500 mg via PEG tube b.i.d.  x10 days, and stop.  Another significant issue during this hospitalization was that of nutrition.  Because of significant dementia, and because of multiple factors, to include  advanced age and recurrent urinary tract infections with altered mental  status, the patient has been losing weight.  Her p.o. intake has been very minimal.  There are significant concerns of  dysphagia.  Speech pathology was  consulted during this hospitalization.  They evaluated the patient  initially, but their first evaluation was not deemed to be sufficient  because of the altered mental status.  When the patient returned to her  baseline mental status, they did provide further evaluation.  This included  a modified barium swallow which was carried out on August 09, 2003.  This  actually proved that there was no evidence of pharyngeal or lower  dysphagia/swallowing dysfunction.  It was appreciated that there was  significant oral phase dysphagia; however, the patient failed to initiate  swallowing.  This was felt to be secondary to the patient's severe dementia,  and not an actual functional swallowing issue.  Because of this, it was  ultimately felt that the patient would be very unreliable in regards to her  p.o. intake.  A discussion was held with the family as to the different  options.  They ultimately did desire with a PEG tube placement.  Dr. Virginia Rochester was  consulted and evaluated the patient.  She underwent a PEG tube placement  successfully, as noted above.  She had no complications from this.  In the  days after her PEG tube placement, she was complaining of some abdominal  pain.  Hemoglobin was dropping somewhat, and it was not clear whether this  was due to dilution or an actual blood loss.  As a result, a CT scan of the  abdomen was obtained.  There is no evidence of hematoma formation or  abscess.  Hemoglobin stabilized at around 8.5.  It was felt that her  hemoglobin drop was more reflective of volume expansion and dilutional  effect with increase of free water flushes to her PEG tube.  The PEG tube  inspection was clean without immediate signs of complications.  The patient  was tolerating her tube feeds at the recommended rate from out nutritional  consultation.  The patient was cleared for discharge.  The patient was admitted to the hospital with known severe  peripheral  vascular disease and malnutrition.  Inherent to these problems were chronic  ulcers of the sacrum as well as both heels.  A wound care specialty nurse  was consulted during this hospitalization.  The recommendations were those  made this specialty nurse.  These were followed during this hospitalization.  There was no significant improvement, but certainly no further decline in  the wounds during this patient's hospitalization.  It is not anticipated  that  the patient's wounds are likely to heal, because of her advanced age  and multiple complicating factors, to include severe protein calorie  malnutrition, urinary incontinence and bedridden status.  It was previously known prior to this admission that this patient suffered  with a 1.0 cm lesion appreciable on CT scan.  This was re-evaluated at the  time of her presentation to the emergency room.  This was stable in size in  comparison to her previous CT scan in December 2004.  These findings were most consistent with a benign meningioma.  This was explained to the family  and no further evaluation was necessary.  The patient has multiple other medical problems, to include hypertension,  coronary artery disease and dementia.  Fortunately these were all stable  during this hospitalization.   DISPOSITION:  By August 18, 2003, the patient was cleared for discharge home.  Her vital signs were stable.  She was tolerating her tube feeds without  difficulty. Her hemoglobin was stable at 8.5.  A urine culture revealed  100,000 Proteus, but susceptibilities remained pending.  The patient was  without complaints.  The patient is being returned to her nursing home of  choice, to continue the PEG tube feeds there.   FOLLOW-UP CARE:  Will be provided by the attending physician at the  patient's nursing home of choice.  The patient's urine culture revealed  greater than 100,000 Proteus just prior to her discharge.  This should be  followed  up on, to assure that Cipro is a reasonable antibiotic choice.  Otherwise the basic issues would be to attend to her sacral and heel wounds  religiously, and to reassess her volume and nutritional needs, to determine  if her current free water flushes and nutritional supplement are adequate.                                                Lonia Blood, M.D.    JTM/MEDQ  D:  08/18/2003  T:  08/18/2003  Job:  [id number]

## 2010-09-15 NOTE — H&P (Signed)
NAME:  [name], [name]                           ACCOUNT NO.:  [id number]   MEDICAL RECORD NO.:  [id number]                   PATIENT TYPE:  INP   LOCATION:  0340                                 FACILITY:  [facility]   PHYSICIAN:  Rosanne Sack, M.D.         DATE OF BIRTH:  1915/06/01   DATE OF ADMISSION:  05/17/2003  DATE OF DISCHARGE:                                HISTORY & PHYSICAL   PROBLEM LIST:  1. Recurrent episodes of unresponsiveness of unclear etiology.  2. Left heel dry gangrene.  3. Uncontrolled hypertension.  4. Normocytic anemia, hemoglobin 10.2.     a. History of baseline hemoglobin around 12 in April, 2004.  5. Chronic renal insufficiency, baseline creatinine between 1.2 to 1.5.  6. Coronary artery disease, questioned myocardial infarction in 1982.  7. Status post pacemaker placement in 1990.  8. Hyperlipidemia.  9. History of chest pain secondary to costochondritis in April, 2004.  10.      History of dyspnea on exertion in the past.  11.      Dementia associated with agitation and aggressive behavior in the     past.   CHIEF COMPLAINT:  Unresponsiveness.   HISTORY OF PRESENT ILLNESS:  [name] is an 77 year old African-American  female residing at [facility].  The patient was sent to the emergency  department today after having two episodes of unremarkable of  unresponsiveness described as lack of response to painful stimuli or  blinking.  The patient only keeps her eyes open and fixed.  These episodes  tend to last about 8 to 10 minutes.  No bowel or urinary incontinence.  No  focal weakness.  No postictal state.  Not related to positional body  changes.  No swallowing problems.  No headache.  The patient has partial  memory of all these episodes though she is able to state that she hears  people talking to her but she is not able to talk to them.  There is no  evidence of these symptoms at the current time in the emergency department.  In  reviewing the records, there was a similar episode around November, 2004.  The patient denies any previous history of seizure activity.  There is no  evidence of tonic clonic body movement during these episodes.  No recent  fever, chills, nausea or vomiting, diarrhea, constipation.  The patient has  recently been receiving some antibiotics for aching associated to the left  dry gangrene affecting the left heel.  No chest pain, palpations, or  shortness of breath with these symptoms.   For past medical history see the problem list.   ALLERGIES:  None.   MEDICATIONS:  1. Enteric coated aspirin one p.o. daily.  2. Multivitamin one tablet p.o. daily.  3. Colace 100 mg p.o. daily.  4. Zinc 220 mg p.o. daily.  5. Vasotec 10 mg p.o. b.i.d.  6. Geodon 80 mg p.o. b.i.d.  7. Lopressor  25 mg p.o. b.i.d.  8. Vitamin C 500 mg p.o. b.i.d.  9. Nitroglycerin 0.4 mg sublingual p.r.n. chest pain.  10.      Tylenol p.r.n.  11.      Clonidine 0.1 q.8h p.r.n.  12.      Uncontrolled hypertension.   FAMILY MEDICAL HISTORY:  Noncontributory given the patient's advanced age.   SOCIAL HISTORY:  The patient is residing at [facility].  She is a  widow.  She has nine children.  She quit smoking in the 1970's.  The patient  has a previous history of heavy alcohol abuse in the past.   PHYSICAL EXAMINATION:  VITAL SIGNS:  Temperature 98.3, blood pressure  175/76, heart rate 64, respirations 18, oxygen saturation 98% on room air.  HEENT:  Normocephalic, atraumatic.  Anicteric sclerae.  Conjunctivae  slightly pale, otherwise within normal limits.  Pupils are equal, round and  reactive to light and accommodation.  Extraocular movements are intact.  Funduscopic exam negative for papilledema or hemorrhages.  Dry mucous  membranes.  Tympanic membranes within normal limits.  Oropharynx clear.  NECK:  Supple, no JVD, bruits, lymphadenopathy or thyromegaly.  LUNGS:  Clear to auscultation bilaterally without  crackles or wheezes. Fair  air movement bilaterally.  CARDIAC:  Regular rate and rhythm with a 1/6 systolic ejection murmur at the  left lower sternal border.  No S3, no rubs.  ABDOMEN:  Slightly obese, nontender, nondistended, bowel sounds were  present. No hepatosplenomegaly.  No rebound, guarding or masses.  No bruits.  GU:  Within normal limits.  RECTAL:  Empty vault, normal sphincter tone.  No masses.  BREASTS EXAM:  Within normal limits.  EXTREMITIES:  Bilateral trace edema.  There are necrotic changes pretty much  affecting the whole left heel.  There is no evidence of erythema or  drainage.  In the right foot there are early signs of necrotic blister on  the right heel and some stage one ulcer of the right lateral malleolus.  The  pulses are weak, 1+.  There is no cyanosis, no clubbing.  NEURO EXAM:  Alert and oriented x2.  The strength is 5/5 in the upper  extremities.  Lower extremities 4+/5 bilaterally. Deep tendon reflexes 2 to  3/5 in all extremities.  Plantare reflexes down going bilaterally.  Peroneal  nerves 2 through 12 grossly intact.  Sensorium decreased in the lower  extremities.  <   LABORATORY DATA:  Brain CT scan reveals no acute changes, there is evidence  of atrophy and small vessel disease.  Creatinine 1.5, BUN 11, sodium 138,  potassium 4.0, chloride 106.  Hemoglobin 10.2, MCV 86, WBC is 9600,  platelets 317,000.   ASSESSMENT:  PROBLEM 1.  Recurrent episodes of unresponsiveness of unclear  etiology - the differential diagnoses include partial complex seizures,  absence seizures, transient ischemic attack at the brain stem, cardiac  arrhythmia, histrionic behavior, dementia.  Less likely etiologies at this  point seem to be pulmonary embolus, cardiac ischemia.   PLAN:  Admit the patient to a telemetry bed to monitor cardiac wise.  If  needed, a pacemaker interrogation will be pursued.  An EEG will be obtained in the morning along with carotid arterial  Dopplers.  Also orthostatic blood  pressures will be obtained to rule out significant orthostasis.  A total CPK  will be obtained tonight as well as in the morning.  If clinical symptoms of  these episodes of unresponsiveness, a prolactin level will be obtained.  Empirically we will Plavix to aspirin.  Also a neurology consult will be  obtained after the EEG.   PROBLEM 2.  Left heel dry gangrene associated with pressure and peripheral  vascular disease - currently, there is no evidence of cellulitis although in  the recent past the patient received Levaquin and clindamycin.  The patient  was supposed to see Dr. Edilia Bo, CVTS, on May 25, 2003.  During this  admission will arrange to get arterial Doppler studies with arterial  brachial indices.  Dr. Edilia Bo will be consulted.  Will avoid pressure to  the areas affected.  A WOC consult will be obtained.  No need for antibiotic  therapy at this point.  Will obtain an x-ray of the heels to rule out  osteomyelitis.  Will provide with multivitamins, zinc and local care.   PROBLEM 3.  Uncontrolled hypertension - for now will resume the home  medications and follow the blood pressure over night to look for further  changes.   PROBLEM 4.  Normocytic anemia - the patient had a hemoglobin of 12 in April  of 2004.  She denies melena, tarry stools, or bright red blood per rectum.  She is on aspirin daily.  Will check stool guaiacs.  Will check a ferritin  level along with a B12, and a TSH.  Hemoglobin will be monitored throughout this hospital stay.  Will use  Protonix for GI protection.   PROBLEM 5.  Chronic renal insufficiency - in April, 2004, the patient's  creatinine was 1.1 to 1.2.  The blood work from the nursing home revealed  that creatinine has been up to 1.5 which is the same as today.  Would  provide fluids at a slow rate and follow renal function throughout this  hospital stay.   PROBLEM 6.  Will provide Lovenox for deep venous  thrombosis prophylaxis.                                                Rosanne Sack, M.D.    JM/MEDQ  D:  05/17/2003  Blackburn:  05/17/2003  Job:  [id number]   cc:   Di Kindle. Edilia Bo, M.D.  [address]  [place]  [place] [address]   Lyn Records III, M.D.  [address]  [address]  [place]  [place] [address]  Fax: [phone]   Maxwell Caul, M.D.  [address]  [place]  [place] [address]  Fax: [phone]

## 2010-09-15 NOTE — Discharge Summary (Signed)
[name], [name]                           ACCOUNT NO.:  [id number]   MEDICAL RECORD NO.:  [id number]                   PATIENT TYPE:  INP   LOCATION:  0340                                 FACILITY:  [facility]   PHYSICIAN:  Maxwell Caul, M.D.             DATE OF BIRTH:  [birth year]   DATE OF ADMISSION:  05/17/2003  DATE OF DISCHARGE:  05/21/2003                                 DISCHARGE SUMMARY   CHIEF COMPLAINT:  Unresponsiveness.   DISCHARGE DIAGNOSES:  1. Recurrent unresponsiveness, unclear etiology, question transient ischemic     attack secondary to hypertension versus other unknown etiology.     a. CT scan of the brain May 16, 2003 notes atrophy and small vessel        disease with no acute findings.  MRA/MRI not possible due to previous        pacemaker placement.     b. EEG study May 17, 2003 notes mild diffuse background slowing.        This may be primarily degenerative basis related to dementia or a        variety of toxic or metabolic etiologies.     c. Carotid Doppler study May 17, 2003 noted right 60-80% internal        carotid artery stenosis with left side no internal carotid artery        stenosis.  There was vertebral artery flow antegrade bilaterally.        This is not thought contributory to this presentation.     d. Per telemetry monitoring over the course of hospitalization no obvious        arrhythmias.     e. Discharged on aspirin and Plavix prophylaxis.  2. Left heel dry gangrene.     a. Lower extremity arterial Doppler study May 17, 2003 noted        bilateral ankle-brachial index demonstrating severe reduction of flow        with the left side worse than the right.     b. A left foot/heel x-ray May 17, 2003 indicated osteomyelitis with        and without superimposed calcaneal fracture.     c. Cardiovascular thoracic surgery consult Dr. Edilia Bo felt patient not a        candidate for revascularization.  Felt there is no current  need for        amputation but if gangrene became wet type or if the site became        obviously infected that he would consider amputation at that time.  3. Uncontrolled hypertension with some improvement status post increased     Norvasc and beta blocker.  Status post addition of clonidine 0.1 mg twice     daily.  Systolic blood pressure still elevated 180 range, we will     increase this at discharge to 0.2 mg twice daily.  a. Question presence renal artery stenosis.     b. Will need close followup pulse and blood pressures post discharge.  4. Normocytic anemia likely multifactorial of chronic disease and     superimposed iron deficiency (see labs below).     a. Would check stool occult blood x3 post discharge on aspirin and        Plavix.     b. Iron replacement with Trinsicon.     c. Recheck hemoglobin and hematocrit 1-2 weeks post discharge.  5. Rule out urinary tract infection with negative urinalysis.  6. Dehydration and hypokalemia.     a. Baseline creatinine 1.2 to 1.5 range.     b. Dehydration and hypokalemia resolved with intravenous fluids and        replacement at discharge BUN 14 and creatinine 1.1.     c. Request followup basic metabolic panel 1-2 weeks post discharge.  7. Deep vein thrombosis prophylaxis over the course of hospitalization with     Lovenox.  8. Coronary artery disease, question myocardial infarction 1982.  9. Status post pacemaker placement 1990.  10.      Hyperlipidemia.  11.      History chest pain secondary to costochondritis April 2004.  12.      History of dyspnea on exertion.  13.      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  14.      Allergies listed no known drug allergies.  15.      Code status currently Full Code per family request.   DISCHARGE MEDICATIONS:  1. Vasotec 10 mg twice daily.  2. Plavix 75 mg daily.  3. Multivitamin daily.  4. Zinc 220 mg daily.  5. Aspirin enteric coated 325 mg daily.  6. Colace  100 mg daily.  7. Geodon 80 mg twice daily.  8. Protonix 40 mg daily.  9. Vitamin C 500 mg twice daily.  10.      Norvasc 10 mg daily.  11.      Lopressor 50 mg twice daily.  12.      Clonidine 0.2 mg twice daily.  13.      Trinsicon one tablet twice daily.  14.      Nutritional supplementation per facility protocol.  15.      Sublingual Nitrostat/nitroglycerin 0.4 mg as needed for angina     repeat every 5 minutes until relief to maximum of three tablets in 15     minutes.  Notify MD if no relief noted after three tablets.  16.      Ambien 5 mg daily at bedtime as needed for insomnia.  17.      Tylenol 650 mg as needed for mild pain or fever.  18.      Senokot one or two tablets daily at bedtime as needed for     constipation.   SPECIAL DISCHARGE INSTRUCTIONS:  1. Disposition:  Patient returning to [facility] primary care Dr.     Baltazar Najjar.  2. Activity:  Up to chair as tolerated with full assistance.  3. Discharge diet:  Low sodium, low cholesterol, low fat.  4. Heels should be floated off of the mattress to avoid pressure or a     specialty pressure relief boot utilized.  5. Should have a repeat hemoglobin, hematocrit, basic metabolic panel 1-2     weeks post discharge.  6. Should have an outpatient followup with Dr. Edilia Bo, CVTS, please call     for appointment  and arrange transportation.  7. Should have routine checks pulse and blood pressure.  8. Should have stools checked x3 for occult blood and notify of occult blood     positivity.   CONSULTATIONS OVER THE COURSE OF HOSPITALIZATION:  Dr. Edilia Bo, CVTS.   PROCEDURES OVER THE COURSE OF HOSPITALIZATION:  1. CT scan of the head May 16, 2003 with results as above.  2. EEG May 17, 2003 with results as above.  3. Carotid Doppler study May 17, 2003 with results as above.  4. Lower extremity arterial Doppler study May 17, 2003 with results as     above.  DISCHARGE LABORATORIES:  Metabolic panel  May 21, 2003 found sodium 137,  potassium 3.9, chloride 106, CO2 25, BUN 14 and creatinine 1.1.  CBC done  May 21, 2003 found WBC 8.9, hemoglobin 10.1, hematocrit 31, platelets  318.  Urine microscopic was unremarkable with only 0-2 wbc's.  Anemia panel  - iron low 37, binding capacity low 192, iron saturation low 19.  Total CK  was 90.  TSH 0.386.  B12 788 and ferritin 266.  Radiology as above.   HISTORY OF PRESENT ILLNESS:  [age]-year-old female resident of [facility] followed in primary care by Dr. Baltazar Najjar.  She  was taken to the emergency room at [facility] after having two  episodes of nonresponsiveness with lack of movement to painful stimuli and  no blink reflex.  Patient had her eyes open and was staring fixedly.  These  episodes tend to last 8-10 minutes.  No bowel or bladder incontinence.  No  obvious postictal state.  Partial memory remains of these episodes stating  she hears people talking but she is not able to talk back.  Per records  review I noted similar episode November 2004.  The patient denied history of  seizure activity.  No evidence of tonic clonic movement during these  episodes.  No current complaints of infection except for left heel dry  gangrene.  She denied chest pain or palpation.  She denied shortness of  breath.  For dictated family history, social history, admission medications,  review of systems, physical exam, laboratory data, impressions and plan  please see dictated history and physical from May 17, 2003.   HOSPITAL COURSE:  Problem 1. RECURRENT NONRESPONSIVE EPISODES.  Patient was  admitted to [facility] following two unresponsive episodes that  did not suggest a typical grand mal seizure.  Unclear etiology at  presentation.  Suspected possible TIA secondary to hypertension.  CT scan of  the brain was done on admission and this noted atrophy and small vessel  disease but no acute  changes.  The next natural step would be to proceed  with an MRI/MRA but this is not possible due to previous pacemaker  placement.  An EEG was done to evaluate for epileptiform activity and this  was negative for this activity.  She was monitored over the course of  hospitalization with telemetry and this was also unremarkable.  Orthostatic  blood pressures were checked with no significant orthostatic drop noted.  Did have a carotid Doppler study noting 60-80% right ICA stenosis though  this thought noncontributory.  Patient placed on aspirin and Plavix  prophylaxis.  No further episodes of unresponsiveness noted over the course  of hospitalization.   Problem 2. LEFT HEEL DRY GANGRENE.  Patient does have significant left heel  formation dry gangrene.  A plain film x-ray of the left heel  noted  osteomyelitis with possible superimposed calcaneal fracture.  The patient  was initially placed on Rocephin but this was discontinued with no obvious source of infection found.  The heel changes thought most likely to be  chronic as the patient had no fever or leukocytosis.  A lower extremity  Doppler study of the arteries done May 17, 2003 did note severe  reduction arterial flow per bilateral ABI's left side worse than right.  A  CVTS consult obtained with Dr. Edilia Bo.  He did not feel that the patient  was a candidate for revascularization.  Did not feel the patient currently  required surgical amputation with no wet gangrene or indication of active  infection.  Felt that if the site became actively infected or transforms to  wet gangrene he would consider surgical amputation at that time.  Antibiotics would likely be nonhelpful due to the poor arterial blood flow.  The patient is to have an outpatient followup with Dr. Edilia Bo post  discharge with facility to arrange appointment and transportation.   Problem 3. UNCONTROLLED HYPERTENSION.  Systolic blood pressure was elevated  in the 180  range.  Patient's medications were adjusted with increase in  Norvasc and beta blocker.  This produced mild improvement but she was still  hypertensive.  Clonidine 0.1 mg twice daily added with some improvement  though at discharge systolic blood pressures are still in the 150-180 range.  At discharge I am increasing her clonidine to 0.2 mg twice daily.  Will need  close follow up of pulse and blood pressure post discharge with further  medication adjustments if indicated.   Problem 4. NORMOCYTIC ANEMIA.  Likely chronic disease and component of iron  deficiency.  The patient was noted to have a normocytic anemia.  By history  baseline hemoglobin in the 10.2 range.  She was a bit dehydrated on  admission with hemoglobin 11.6 and with dehydration this dropped to 9.6.  Post-hydration hemoglobin had settled in the 10.1 range and remains stable.  Per her labs there does appear to be a component of iron deficiency.  She  was placed on Trinsicon twice daily.  Would check stools for occult blood  post discharge x3 with aspirin and Plavix therapy.  Would recheck a  hemoglobin and hematocrit 1-2 weeks post discharge.   Problem 5. RULE OUT URINARY TRACT INFECTION.  With unresponsiveness a  urinary tract infection was ruled out.  Urinalysis on admission appeared  unremarkable.  A repeat urinalysis and microscopic exam was done May 20, 2003 with only 0-2 wbc's.  Patient initially did have Rocephin antibiotic  therapy but this was discontinued with no active sign of infection noted.   Problem 6. DEHYDRATION AND HYPOKALEMIA.  On admission the patient appeared  mildly dehydrated with BUN 11 and creatinine 1.5.  Post admission she  developed some borderline hypokalemia at 3.5.  She was placed on IV fluids  for hydration and her renal function improved to BUN 8 and creatinine 1.1.  At discharge BUN 14 and creatinine 1.1.  Her previous baseline creatinine  ranges 1.2 to 1.5.  Her potassium improved  with replacement and at discharge is 3.9.  I believe patient has some degree of failure to thrive.  We will  need to aggressively encourage oral fluids post discharge but also follow  weights routinely to monitor for weight loss.  Would supplement with dietary  supplements post discharge per facility protocol.  Would recheck a basic  metabolic panel in 1-2 weeks.  Problem 7. DEEP VEIN THROMBOSIS PROPHYLAXIS.  The patient received Lovenox  over the course of hospitalization for this purpose.   Problem 8. CODE STATUS.  Patient Full Code per family request.   DISPOSITION:  Discharged to [facility] primary care Dr.  Baltazar Najjar with followup as above.   ACTIVITY:  Up as tolerated to a chair with full assistance.   WOUND CARE:  Patient does have dry gangrene greater on the left than the  right.  Her heel should be floated off of the heel with a pillow or a  specialty heel protection boot utilized.  Wound care team at the facility  should follow routinely for any signs of infection or wet gangrene.   CONDITION AT TIME OF DISCHARGE:  Stable/improved though frail with multiple  medical problems as above.  Considering family's propensity to desire  aggressive care and the patient's multiple medical problem cannot disclude  the need for future hospitalization.   DISCHARGE PROCESS:  Greater than 30 minutes 9 a.m. through 9:45 a.m.     Everett Graff, N.P.                     Maxwell Caul, M.D.    TC/MEDQ  D:  05/21/2003  T:  05/21/2003  Job:  [id number]   cc:   Di Kindle. Edilia Bo, M.D.  [address]  [place]  [place] [address]

## 2010-09-15 NOTE — Consult Note (Signed)
NAME:  [name], [name]                           ACCOUNT NO.:  [id number]   MEDICAL RECORD NO.:  [id number]                   PATIENT TYPE:  INP   LOCATION:  0340                                 FACILITY:  [facility]   PHYSICIAN:  Di Kindle. Edilia Bo, M.D.        DATE OF BIRTH:  1915-12-25   DATE OF CONSULTATION:  05/18/2003  DATE OF DISCHARGE:                                   CONSULTATION   REASON FOR CONSULTATION:  Dry gangrene of the left heel and a 60% right  carotid stenosis.   HISTORY:  This is a pleasant 77 year old woman who I have actually seen in  consultation on April 14, 2003 in the office with dry gangrene of the  left heel.  At that time, she had evidence of superficial femoral artery and  tibial occlusive disease.  I felt that she was a very high risk for surgery  and therefore elected to simply follow this wound with the thought that we  would consider primary amputation if the wound progressed.  The patient was  admitted on May 16, 2003 with episodes of unresponsiveness.  I was asked  to see the patient concerning her left heel decubitus and also concerning a  carotid duplex finding of a 60% to 80% right carotid stenosis.  Of note, it  is difficult to obtain the history from the patient, and I think she does  have a history of some underlying dementia.  Based on my office records from  a month ago, she does have a history of hypertension and a history of a  previous myocardial infarction several years ago.  In addition, she has  hypercholesterolemia.  She also had a pacemaker placed in 1990.  She denies  any history of diabetes or any history of congestive heart failure or  emphysema.   FAMILY HISTORY:  Her father died from a stroke at age 71.  There is no other  history of premature cardiovascular disease that she is aware of.   SOCIAL HISTORY:  She is widowed and has 7 children.  She does not use  tobacco.   REVIEW OF SYSTEMS:  GENERAL:  She has lost some  weight recently. She has had  no recent chest pain. She does admit to some dyspnea on exertion.  Of note,  she is non-ambulatory.  She does not give any real clear-cut history of rest  pain in either foot.  She has had no previous history of stroke, TIAs or  amaurosis fugax.   PHYSICAL EXAMINATION:  VITAL SIGNS:  Temperature is 99.4, blood pressure  165/73.  HEENT: I did not detect any carotid bruits.  LUNGS:  Clear bilaterally to auscultation.  CARDIAC:  She has a regular rate and rhythm.  ABDOMEN: The abdomen is soft and nontender.  I can not palpate an aneurysm.  She has palpable femoral pulses and a palpable right popliteal pulse.  I  cannot palpate distal pulses  on either side.  She has a monophasic anterior  tibial signal on the right and a monophasic anterior tibial signal on the  left.  I cannot get a posterior tibial signal on either side.  She has dry  gangrene of the left heel with no drainage or erythema currently.  She has  no significant lower extremity swelling.  On the right side, she has some  slight discoloration at the heel but no ulcer.   LABORATORY DATA:  Her arterial Doppler studies show an ABI of 53% on the  right and 29% on the left.   Carotid duplex scan shows a 60 to 80% right carotid stenosis, although this  is likely in the lower end of this range based on end-diastolic velocity of  36 cm per second.  She has no significant stenosis on the left side.  Both  vertebral arteries are patent with normally directed flow.   IMPRESSION:  1. With respect to her heel decubitus,  this has not changed over the last     month since I saw her last.  As she is not a candidate for     revascularization, I think we should simply continue to keep this dry and     follow this and only proceed with primary amputation if and when the heel     wound progresses. Based on the x-rays, she has evidence of osteomyelitis     and so I did not think there is any chance this will be a  limb     salvageable situation.  She is very high risk for surgery in terms of a     bypass, especially, and given the osteomyelitis even with successful     revascularization, I do not think this is a limb salvageable situation.     If and when the wound progresses, then she will need to have a primary     below the knee amputation.  2. I will see her back in the office in six weeks in my office per     arrangements.  3. With respect to her carotid disease, this is only a moderate right     carotid stenosis and she is asymptomatic from this standpoint.  I will     arrange for follow up carotid duplex scan in six months and see her at     that time.  We would only consider right carotid endarterectomy if the     stenosis progressed to greater than 80% or if she developed right     hemispheric symptoms.  However, given her age and multiple medical     problems, again she would be at high risk for surgery and will have to     give this careful consideration even under these circumstances.   I will follow up as an outpatient.  Thanks for the consult.                                               Di Kindle. Edilia Bo, M.D.    CSD/MEDQ  D:  05/18/2003  T:  05/19/2003  Job:  [id number]   cc:   Rosanne Sack, M.D.  [address]  [place], [place] [address]  Fax: [phone]

## 2010-09-15 NOTE — Procedures (Signed)
CLINICAL HISTORY:  The patient is an 77 year old with seizures with  dementia.  The study is being done with the presence of a seizure disorder.   PROCEDURE:  The study is carried out on a 32-channel digital Cadwell  recorder reformatted into 16-mannel montages with one devoted to EKG.  The  patient was awake during the recording.  Her medications are not specified.   DESCRIPTION OF FINDINGS:  Dominant frequency of 7 Hz contoured 30 microvolt  activity, 30-45 microvolt activity is well regulated and attenuates  partially with eye opening.   Background activity shows broadly distributed rhythmic theta range activity  with superimposed under 10 microvolt theta range activity.  Occasional  dysrhythmic theta activity is seen, although for the most part the  background is well organized, despite being below the limits for normal for  background activity.   Hyperventilation was not able to be carried out because the patient was  confused.  Intermittent photic stimulation was also not carried out.  EKG  showed a regular sinus rhythm with ventricular response of 72 beats/minute.   IMPRESSION:  This is an abnormal electroencephalogram on the basis of mild  diffuse background.  This is a nonspecific indicator of neuronal dysfunction  and may be on a primary degenerative basis related to the patient's dementia  or to a variety of toxic and/or metabolic etiologies.  This is consistent  with the patient's clinical history.    WILLIAM H. Sharene Skeans, M.D.   [id number]  D:  17/05/2003 16:07:08  T:  17/05/2003 16:22:14  Job #:  [id number]   cc:   Rosanne Sack, M.D.  [address]  [place], [place] [address]  Fax: [phone]

## 2011-01-18 LAB — BASIC METABOLIC PANEL
Calcium: 9.4
GFR calc Af Amer: 50 — ABNORMAL LOW
GFR calc non Af Amer: 41 — ABNORMAL LOW
Potassium: 4.8
Sodium: 137

## 2011-01-18 LAB — CBC
HCT: 39
Hemoglobin: 12.8
WBC: 8.3

## 2011-01-18 LAB — APTT: aPTT: 27

## 2011-01-18 LAB — PROTIME-INR: INR: 1

## 2012-04-14 ENCOUNTER — Encounter: Payer: Self-pay | Admitting: *Deleted

## 2012-04-14 ENCOUNTER — Encounter: Payer: Self-pay | Admitting: Internal Medicine

## 2012-04-15 ENCOUNTER — Ambulatory Visit (HOSPITAL_COMMUNITY)
Admission: RE | Admit: 2012-04-15 | Discharge: 2012-04-15 | Disposition: A | Discharge status: Other Acute Inpatient Hospital | Payer: Medicare Other | Source: Ambulatory Visit | Attending: Internal Medicine | Admitting: Internal Medicine

## 2012-04-15 ENCOUNTER — Encounter (HOSPITAL_COMMUNITY): Payer: Self-pay | Admitting: Cardiology

## 2012-04-15 ENCOUNTER — Encounter (HOSPITAL_COMMUNITY)
Admission: RE | Disposition: A | Discharge status: Nursing Home (Includes ICF) | Payer: Self-pay | Source: Ambulatory Visit | Attending: Internal Medicine

## 2012-04-15 ENCOUNTER — Encounter (HOSPITAL_COMMUNITY): Payer: Self-pay | Admitting: Pharmacy Technician

## 2012-04-15 DIAGNOSIS — I10 Essential (primary) hypertension: Secondary | ICD-10-CM | POA: Diagnosis present

## 2012-04-15 DIAGNOSIS — Z45018 Encounter for adjustment and management of other part of cardiac pacemaker: Secondary | ICD-10-CM | POA: Insufficient documentation

## 2012-04-15 DIAGNOSIS — I495 Sick sinus syndrome: Secondary | ICD-10-CM

## 2012-04-15 DIAGNOSIS — N189 Chronic kidney disease, unspecified: Secondary | ICD-10-CM | POA: Insufficient documentation

## 2012-04-15 DIAGNOSIS — F068 Other specified mental disorders due to known physiological condition: Secondary | ICD-10-CM | POA: Diagnosis present

## 2012-04-15 DIAGNOSIS — F0390 Unspecified dementia without behavioral disturbance: Secondary | ICD-10-CM | POA: Insufficient documentation

## 2012-04-15 DIAGNOSIS — I129 Hypertensive chronic kidney disease with stage 1 through stage 4 chronic kidney disease, or unspecified chronic kidney disease: Secondary | ICD-10-CM | POA: Insufficient documentation

## 2012-04-15 HISTORY — DX: Anemia, unspecified: D64.9

## 2012-04-15 HISTORY — PX: PERMANENT PACEMAKER GENERATOR CHANGE: SHX6022

## 2012-04-15 HISTORY — DX: Peripheral vascular disease, unspecified: I73.9

## 2012-04-15 HISTORY — DX: Unspecified dementia, unspecified severity, without behavioral disturbance, psychotic disturbance, mood disturbance, and anxiety: F03.90

## 2012-04-15 HISTORY — DX: Chronic kidney disease, unspecified: N18.9

## 2012-04-15 HISTORY — PX: PACEMAKER GENERATOR CHANGE: SHX5998

## 2012-04-15 LAB — DIFFERENTIAL
Basophils Absolute: 0 10*3/uL (ref 0.0–0.1)
Eosinophils Absolute: 0.9 10*3/uL — ABNORMAL HIGH (ref 0.0–0.7)
Eosinophils Relative: 11 % — ABNORMAL HIGH (ref 0–5)
Lymphocytes Relative: 36 % (ref 12–46)
Lymphs Abs: 2.9 10*3/uL (ref 0.7–4.0)
Neutrophils Relative %: 45 % (ref 43–77)

## 2012-04-15 LAB — CBC
MCH: 29.8 pg (ref 26.0–34.0)
MCV: 92.6 fL (ref 78.0–100.0)
Platelets: 162 10*3/uL (ref 150–400)
RBC: 3.93 MIL/uL (ref 3.87–5.11)
RDW: 15.3 % (ref 11.5–15.5)
WBC: 8.2 10*3/uL (ref 4.0–10.5)

## 2012-04-15 LAB — BASIC METABOLIC PANEL
Calcium: 8.9 mg/dL (ref 8.4–10.5)
GFR calc Af Amer: 32 mL/min — ABNORMAL LOW (ref >90)
GFR calc non Af Amer: 28 mL/min — ABNORMAL LOW (ref >90)
Glucose, Bld: 84 mg/dL (ref 70–99)
Potassium: 4.9 mEq/L (ref 3.5–5.1)
Sodium: 140 mEq/L (ref 135–145)

## 2012-04-15 LAB — SURGICAL PCR SCREEN
MRSA, PCR: POSITIVE — AB
Staphylococcus aureus: POSITIVE — AB

## 2012-04-15 LAB — PROTIME-INR: Prothrombin Time: 14 seconds (ref 11.6–15.2)

## 2012-04-15 SURGERY — PERMANENT PACEMAKER GENERATOR CHANGE
Anesthesia: LOCAL

## 2012-04-15 MED ORDER — CHLORHEXIDINE GLUCONATE 4 % EX LIQD: 60.0000 mL | Freq: Once | CUTANEOUS | Status: DC

## 2012-04-15 MED ORDER — SODIUM CHLORIDE 0.9 % IV SOLN: 250.0000 mL | INTRAVENOUS | Status: DC | PRN

## 2012-04-15 MED ORDER — ONDANSETRON HCL 4 MG/2ML IJ SOLN: 4.0000 mg | Freq: Four times a day (QID) | INTRAMUSCULAR | Status: DC | PRN

## 2012-04-15 MED ORDER — MUPIROCIN 2 % EX OINT
TOPICAL_OINTMENT | Freq: Two times a day (BID) | CUTANEOUS | Status: DC
  Administered 2012-04-15: 1 via NASAL
  Filled 2012-04-15: qty 22

## 2012-04-15 MED ORDER — CEFAZOLIN SODIUM-DEXTROSE 2-3 GM-% IV SOLR: 2.0000 g | INTRAVENOUS | Status: DC

## 2012-04-15 MED ORDER — SODIUM CHLORIDE 0.9 % IR SOLN
80.0000 mg | Status: DC
  Filled 2012-04-15: qty 2

## 2012-04-15 MED ORDER — SODIUM CHLORIDE 0.9 % IV SOLN: 250.0000 mL | INTRAVENOUS | Status: DC

## 2012-04-15 MED ORDER — MUPIROCIN 2 % EX OINT
TOPICAL_OINTMENT | CUTANEOUS | Status: AC
  Filled 2012-04-15: qty 22

## 2012-04-15 MED ORDER — ACETAMINOPHEN 325 MG PO TABS: 325.0000 mg | ORAL_TABLET | ORAL | Status: DC | PRN

## 2012-04-15 MED ORDER — SODIUM CHLORIDE 0.9 % IJ SOLN: 3.0000 mL | INTRAMUSCULAR | Status: DC | PRN

## 2012-04-15 MED ORDER — LIDOCAINE HCL (PF) 1 % IJ SOLN
INTRAMUSCULAR | Status: AC
  Filled 2012-04-15: qty 60

## 2012-04-15 MED ORDER — CEFAZOLIN SODIUM-DEXTROSE 2-3 GM-% IV SOLR
INTRAVENOUS | Status: AC
  Filled 2012-04-15: qty 50

## 2012-04-15 MED ORDER — SODIUM CHLORIDE 0.45 % IV SOLN: INTRAVENOUS | Status: DC

## 2012-04-15 MED ORDER — SODIUM CHLORIDE 0.9 % IJ SOLN: 3.0000 mL | Freq: Two times a day (BID) | INTRAMUSCULAR | Status: DC

## 2012-04-15 NOTE — Progress Notes (Signed)
GIVEN MUPIROCIN AND INSTRUCTION SHEET FOR D/C

## 2012-04-15 NOTE — Op Note (Signed)
SURGEON:  Hillis Range, MD     PREPROCEDURE DIAGNOSES:   1. Sick sinus syndrome.   2. Pacemaker at ERI    POSTPROCEDURE DIAGNOSES:   1. Sick sinus syndrome.   2. Pacemaker at [facility]    PROCEDURES:   1. Pacemaker pulse generator replacement.   2. Skin pocket revision.     INTRODUCTION:  [name] is a 78 y.o. female with a history of sick sinus syndrome. She has done well since her pacemaker was implanted. She has chronic dementia.  She has recently reached ERI battery status.  HShe presents today for pacemaker pulse generator replacement.       DESCRIPTION OF THE PROCEDURE:  Informed written consent was obtained, and the patient was brought to the electrophysiology lab in the fasting state.  The patient's pacemaker was interrogated today and found to be at elective replacement indicator battery status.  The patient required no sedation for the procedure today.  The patient's right chest was prepped and draped in the usual sterile fashion by the EP lab staff.  The skin overlying the existing pacemaker was infiltrated with lidocaine for local analgesia.  A 4-cm incision was made over the pacemaker pocket.  Using a combination of sharp and blunt dissection, the pacemaker was exposed and removed from the body.  The device was disconnected from the leads.  There was no foreign matter or debris within the pocket.  The atrial lead was confirmed to be a Medtronic model [id number] (serial number [id number]) lead implanted on 02/08/04.  The right ventricular lead was confirmed to be a Intermedics model 430-07 (serial number ) lead implanted on 07/31/1991.  Both leads were examined and their integrity was confirmed to be intact.  Atrial lead P-waves measured 1.8 mV with impedance of 418 ohms and a threshold of 1.4 V at 0.5 msec.  Right ventricular lead R-waves measured 9 mV with impedance of 407 ohms and a threshold of 0.7 V at 0.5 msec.  Both leads were connected to a Medtronic Odell model [id number] (serial  number [id number]) pacemaker.  The pocket was revised to accommodate this new device.  Electrocautery was required to assure hemostasis.  The pocket was irrigated with copious gentamicin solution. The pacemaker was then placed into the pocket.  The pocket was then closed in 2 layers with 2-0 Vicryl suture over the subcutaneous and subcuticular layers.  Steri-Strips and a sterile dressing were then applied.  There were no early apparent complications.     CONCLUSIONS:   1. Successful pacemaker pulse generator replacement for elective replacement indicator battery status   2. No early apparent complications.     Hillis Range, MD 04/15/2012 4:48 PM

## 2012-04-15 NOTE — H&P (Signed)
See my consult note from today

## 2012-04-15 NOTE — Consult Note (Signed)
Primary Cardiologist:  Dr Dudley Major is a 78 y.o. female with a h/o bradycardia sp PPM (MDT) who presents today to establish care in the Electrophysiology device clinic.  She recently has been found to have reached ERI battery status.  She V paces 96%.  The patient has advanced dementia.  She is quite limited with her age.  She is unable to provide additional history today.  Her family feels that she is stable. They are unaware of any chest pain, shortness of breath, dizziness, presyncope, syncope, or new neurologic sequela.  She has chronic stable edema.  The patientis tolerating medications without difficulties and is otherwise without complaint today.   Past Medical History  Diagnosis Date  . HTN (hypertension)   . Sick sinus syndrome     s/p PPM, PPM generator change 01/2004  . Dementia     h/o aggressive behavior  . Chronic renal insufficiency   . PVD (peripheral vascular disease)   . Normocytic anemia    Past Surgical History  Procedure Date  . Pacemaker insertion     MDT, most recent generator change with upgrade to dual chamber by Dr Amil Amen 2005    History   Social History  . Marital Status: Widowed    Spouse Name: N/A    Number of Children: N/A  . Years of Education: N/A   Occupational History  . Not on file.   Social History Main Topics  . Smoking status: Never Smoker   . Smokeless tobacco: Not on file  . Alcohol Use: No  . Drug Use: No  . Sexually Active: Not on file   Other Topics Concern  . Not on file   Social History Narrative   Lives in [place]    Pt unable to provide  No Known Allergies  Current Facility-Administered Medications  Medication Dose Route Frequency Provider Last Rate Last Dose  . 0.45 % sodium chloride infusion   Intravenous Continuous Brooke O Edmisten, PA-C      . 0.9 %  sodium chloride infusion  250 mL Intravenous Continuous Brooke O Edmisten, PA-C      . ceFAZolin (ANCEF) IVPB 2 g/50 mL premix  2 g  Intravenous On Call [employer], PA-C      . chlorhexidine (HIBICLENS) 4 % liquid 4 application  60 mL Topical Once [employer], PA-C      . gentamicin (GARAMYCIN) 80 mg in sodium chloride irrigation 0.9 % 500 mL irrigation  80 mg Irrigation On Call [employer], PA-C      . mupirocin ointment (BACTROBAN) 2 %   Nasal BID Hillis Range, MD      . mupirocin ointment (BACTROBAN) 2 %           . sodium chloride 0.9 % injection 3 mL  3 mL Intravenous Q12H Brooke O Edmisten, PA-C      . sodium chloride 0.9 % injection 3 mL  3 mL Intravenous PRN Brooke O Edmisten, PA-C        ROS- pt unable to provide,  Family is unaware of any recent infections or problems  Physical Exam: Vitals pending  GEN- The patient is elderly appearing, confused Head- normocephalic, atraumatic Eyes-  Poor vision Ears- hard of hearing Oropharynx- clear with very poor dentition Neck- supple, no JVP Lungs- Clear to ausculation bilaterally, normal work of breathing Chest- R sided pacemaker pocket is well healed Heart- Regular rate and rhythm, no murmurs, rubs or gallops, PMI  not laterally displaced GI- soft, NT, ND, + BS Extremities- no clubbing, cyanosis, + dependant edema MS- diffuse muscle atrophy Skin- no rash or lesion Psych- advanced dementia Neuro- strength and sensation are intact  Pacemaker interrogation- reviewed in detail today from 04/11/12 interrogation, implanted 02/08/04  Assessment and Plan:  1. Sick sinus syndrome Doing well s/p prior PPM She has reached ERI battery status. Risks, benefits, and alternatives to PPM pulse generator replacement were discussed at length with the patient and her daughter and son (POA).  The patients family would like to proceed with PPM at this time.    2. HTN Stable

## 2012-05-07 ENCOUNTER — Encounter: Payer: Self-pay | Admitting: Internal Medicine

## 2012-05-07 ENCOUNTER — Ambulatory Visit (INDEPENDENT_AMBULATORY_CARE_PROVIDER_SITE_OTHER): Payer: Medicare Other | Admitting: *Deleted

## 2012-05-07 DIAGNOSIS — I495 Sick sinus syndrome: Secondary | ICD-10-CM

## 2012-05-07 LAB — PACEMAKER DEVICE OBSERVATION
AL IMPEDENCE PM: 429 Ohm
AL THRESHOLD: 0.5 V
ATRIAL PACING PM: 97
RV LEAD IMPEDENCE PM: 406 Ohm
RV LEAD THRESHOLD: 0.5 V

## 2012-05-07 NOTE — Progress Notes (Signed)
Wound check-PPM 

## 2012-05-29 ENCOUNTER — Encounter: Payer: Self-pay | Admitting: Internal Medicine

## 2012-07-25 ENCOUNTER — Encounter: Payer: Self-pay | Admitting: Internal Medicine

## 2012-07-25 ENCOUNTER — Ambulatory Visit (INDEPENDENT_AMBULATORY_CARE_PROVIDER_SITE_OTHER): Payer: Medicare Other | Admitting: Internal Medicine

## 2012-07-25 VITALS — BP 172/60 | HR 59

## 2012-07-25 DIAGNOSIS — I10 Essential (primary) hypertension: Secondary | ICD-10-CM

## 2012-07-25 DIAGNOSIS — I495 Sick sinus syndrome: Secondary | ICD-10-CM

## 2012-07-25 LAB — PACEMAKER DEVICE OBSERVATION
AL IMPEDENCE PM: 408 Ohm
AL THRESHOLD: 1.25 V
ATRIAL PACING PM: 98
RV LEAD IMPEDENCE PM: 418 Ohm

## 2012-07-25 NOTE — Patient Instructions (Addendum)
Your physician wants you to follow-up in: 03/2013 with Dr Jacquiline Doe will receive a reminder letter in the mail two months in advance. If you don't receive a letter, please call our office to schedule the follow-up appointment.

## 2012-07-26 ENCOUNTER — Encounter: Payer: Self-pay | Admitting: Internal Medicine

## 2012-07-26 NOTE — Progress Notes (Signed)
[name] is a 79 y.o. female who presents today for routine electrophysiology followup.  Since her recent pacemaker generator change, the patient reports doing very well.  Today, she denies symptoms of palpitations, chest pain, shortness of breath,  lower extremity edema, dizziness, presyncope, or syncope.  The patient is otherwise without complaint today.   Past Medical History  Diagnosis Date  . HTN (hypertension)   . Sick sinus syndrome     s/p PPM, PPM generator change 01/2004 and 12/13  . Dementia     h/o aggressive behavior  . Chronic renal insufficiency   . PVD (peripheral vascular disease)   . Normocytic anemia    Past Surgical History  Procedure Laterality Date  . Pacemaker insertion  07/31/1991  . Pacemaker generator change  04/15/12    MDT Sensia generator change by Dr Johney Frame    Current Outpatient Prescriptions  Medication Sig Dispense Refill  . acetaminophen (TYLENOL) 500 MG tablet Take 1,000 mg by mouth every 6 (six) hours as needed. For pain      . amLODipine (NORVASC) 10 MG tablet Take 1 tablet by mouth daily.      Marland Kitchen aspirin 81 MG tablet Take 81 mg by mouth daily.      . calcium carbonate (OS-CAL - DOSED IN MG OF ELEMENTAL CALCIUM) 1250 MG tablet Take 1 tablet by mouth daily.      . divalproex (DEPAKOTE SPRINKLE) 125 MG capsule Take 3 capsules by mouth 2 (two) times daily. Takes 3 capsules in the morning and at bedtime then takes 2 capsules at 2 pm daily      . EXELON 9.5 MG/24HR Apply 1 patch topically daily.      . furosemide (LASIX) 20 MG tablet Take 1 tablet by mouth daily.      . hydrALAZINE (APRESOLINE) 10 MG tablet Take 10 mg by mouth 3 (three) times daily.      Marland Kitchen lactulose (CHRONULAC) 10 GM/15ML solution Take 20 g by mouth 2 (two) times daily.      Marland Kitchen LORazepam (ATIVAN) 2 MG tablet Take 0.5 mg by mouth daily. For anxiety      . metoprolol (LOPRESSOR) 100 MG tablet Take 1 tablet by mouth Twice daily.      Marland Kitchen morphine (MSIR) 15 MG tablet Take 1 tablet by mouth  daily.      . Multiple Vitamins-Minerals (CERTAGEN PO) Take 1 tablet by mouth daily.      Marland Kitchen NAMENDA 5 MG tablet Take 1 tablet by mouth Twice daily.      . polyethylene glycol (MIRALAX / GLYCOLAX) packet Take 17 g by mouth every other day. Monday, Wednesday, and Friday      . sennosides-docusate sodium (SENOKOT-S) 8.6-50 MG tablet Take 2 tablets by mouth at bedtime.      . sertraline (ZOLOFT) 100 MG tablet Take 2 tablets by mouth daily.      . Nutritional Supplements (BOOST PUDDING PO) Take 1 packet by mouth 3 (three) times daily with meals.       No current facility-administered medications for this visit.    Physical Exam: Filed Vitals:   07/25/12 1155  BP: 172/60  Pulse: 59    GEN- The patient is chronically ill appearing, alert and oriented x 3 today.   Head- normocephalic, atraumatic Eyes-  Sclera clear, conjunctiva pink Ears- hearing intact Oropharynx- clear Lungs- Clear to ausculation bilaterally, normal work of breathing Chest- pacemaker pocket is well healed Heart- Regular rate and rhythm, no murmurs, rubs or  gallops, PMI not laterally displaced GI- soft, NT, ND, + BS Extremities- no clubbing, cyanosis, or edema  Pacemaker interrogation- reviewed in detail today,  See PACEART report  Assessment and Plan:  1. Bradycardia Normal pacemaker function See Pace Art report No changes today  2. HTN Stable No change required today

## 2012-08-01 ENCOUNTER — Non-Acute Institutional Stay (SKILLED_NURSING_FACILITY): Payer: Medicare Other | Admitting: Internal Medicine

## 2012-08-01 DIAGNOSIS — Z79899 Other long term (current) drug therapy: Secondary | ICD-10-CM

## 2012-08-01 DIAGNOSIS — D638 Anemia in other chronic diseases classified elsewhere: Secondary | ICD-10-CM

## 2012-08-01 DIAGNOSIS — R609 Edema, unspecified: Secondary | ICD-10-CM

## 2012-08-05 ENCOUNTER — Other Ambulatory Visit: Payer: Self-pay | Admitting: *Deleted

## 2012-08-05 MED ORDER — MORPHINE SULFATE 15 MG PO TABS: ORAL_TABLET | ORAL | Status: DC

## 2012-08-07 ENCOUNTER — Other Ambulatory Visit: Payer: Self-pay | Admitting: *Deleted

## 2012-08-07 MED ORDER — MORPHINE SULFATE 15 MG PO TABS: ORAL_TABLET | ORAL | Status: DC

## 2012-08-08 ENCOUNTER — Non-Acute Institutional Stay (SKILLED_NURSING_FACILITY): Payer: Medicare Other | Admitting: Internal Medicine

## 2012-08-08 DIAGNOSIS — K5900 Constipation, unspecified: Secondary | ICD-10-CM

## 2012-08-08 DIAGNOSIS — R609 Edema, unspecified: Secondary | ICD-10-CM

## 2012-08-08 DIAGNOSIS — I10 Essential (primary) hypertension: Secondary | ICD-10-CM

## 2012-08-08 DIAGNOSIS — F0390 Unspecified dementia without behavioral disturbance: Secondary | ICD-10-CM

## 2012-08-13 NOTE — Progress Notes (Signed)
Patient ID: [name], female   DOB: 1916-03-24, 79 y.o.   MRN: [id number]        PROGRESS NOTE  DATE:   08/01/2012  FACILITY:  [facility]   LEVEL OF CARE: SNF  Acute Visit  CHIEF COMPLAINT:  Manage worsening lower extremity swelling and anemia of chronic disease.   HISTORY OF PRESENT ILLNESS: I was requested by the staff to assess the patient regarding above problem(s):  Lower extremity swelling.  Staff report that patient's lower extremity swelling has worsened, which was noted this morning.  Patient is a poor historian due to dementia.  Staff cannot identify precipitating factors.  There are no other associated signs and symptoms.  Symptoms are constant.   ANEMIA: On 07/22/2012:  Hemoglobin 10.2, MCV 93.  In 03/2012:  Hemoglobin 12.7.  The anemia has been stable. The staff denies fatigue, melena or hematochezia. No complications from the medications currently being used.    PAST MEDICAL HISTORY : Reviewed.  No changes.  CURRENT MEDICATIONS: Reviewed per [facility]  REVIEW OF SYSTEMS:  Unobtainable due to dementia.   PHYSICAL EXAMINATION  GENERAL: no acute distress, normal body habitus EYES: conjunctivae normal, sclerae normal, normal eye lids NECK: supple, trachea midline, no neck masses, no thyroid tenderness, no thyromegaly LYMPHATICS: no LAN in the neck, no supraclavicular LAN RESPIRATORY: breathing is even & unlabored, BS CTAB CARDIAC: RRR, no murmur,no extra heart sounds,  EDEMA/VARICOSITIES:  +2 bilateral lower extremity edema  ARTERIAL:  pedal pulses nonpalpable  GI: abdomen soft, normal BS, no masses, no tenderness, no hepatomegaly, no splenomegaly PSYCHIATRIC: the patient is alert & oriented to person, affect & behavior appropriate  LABS/RADIOLOGY: 03/2012:  Creatinine 1.12, otherwise BMP normal.   ASSESSMENT/PLAN:  Lower extremity edema.  Worsening problem.  Increase lasix to 40 mg q.d. For three days, then decrease to 20 mg q.d.    Anemia of chronic disease.   Unstable problem.  Hemoglobin has declined.  We will monitor.   Check BMP in one week.    CPT CODE: 40981

## 2012-08-26 NOTE — Progress Notes (Signed)
Patient ID: [name], female   DOB: Jan 13, 1916, 79 y.o.   MRN: [id number]        PROGRESS NOTE  DATE:  08/08/2012  FACILITY: [facility]   LEVEL OF CARE: SNF  Routine Visit  CHIEF COMPLAINT:  Manage dementia and hypertension.   HISTORY OF PRESENT ILLNESS:  REASSESSMENT OF ONGOING PROBLEM(S):  DEMENTIA: The dementia is very advanced and the patient is a poor historian.  The dementia remains stable and continues to function adequately in the current living environment with supervision.  The patient has had little changes in behavior. No complications noted from the medications presently being used.   HTN: Pt 's HTN remains stable.  Staff denies CP, sob, DOE, pedal edema, headaches, dizziness or visual disturbances.  No complications from the medications currently being used.  Last BP : 140/80, 130/60, 126/68.  PAST MEDICAL HISTORY : Reviewed.  No changes.  CURRENT MEDICATIONS: Reviewed per [facility]  REVIEW OF SYSTEMS:  Unobtainable due to dementia.    PHYSICAL EXAMINATION  VS:  T 96.8      P 60     RR  18    BP 140/80    POX %     WT (Lb) 141  GENERAL: no acute distress, normal body habitus NECK: supple, trachea midline, no neck masses, no thyroid tenderness, no thyromegaly RESPIRATORY: breathing is even & unlabored, BS CTAB CARDIAC: RRR, no murmur,no extra heart sounds EDEMA/VARICOSITIES:  +2 bilateral lower extremity edema  ARTERIAL:  pedal pulses nonpalpable  GI: abdomen soft, normal BS, no masses, no tenderness, no hepatomegaly, no splenomegaly PSYCHIATRIC: the patient is alert & oriented to person, affect & behavior appropriate  LABS/RADIOLOGY: 06/2012:  Hemoglobin 10.2, MCV 93, otherwise CBC normal.   Hemoglobin A1C 6.    03/2012:  Creatinine 1.12, otherwise CMP normal.    ASSESSMENT/PLAN:  Hypertension.  Well controlled.    Dementia.  Advanced.   Lower extremity edema.  Stable.   Constipation.  No problems reported by the staff.    Coronary artery disease.   Stable.   Depression.  Continue Zoloft.    Chronic pain.  Currently on morphine sulfate.  The patient appears comfortable.    Check Depakote level.    CPT CODE: 40981

## 2012-08-29 ENCOUNTER — Non-Acute Institutional Stay (SKILLED_NURSING_FACILITY): Payer: Medicare Other | Admitting: Internal Medicine

## 2012-08-29 DIAGNOSIS — F0390 Unspecified dementia without behavioral disturbance: Secondary | ICD-10-CM

## 2012-08-29 DIAGNOSIS — K219 Gastro-esophageal reflux disease without esophagitis: Secondary | ICD-10-CM

## 2012-08-29 DIAGNOSIS — I10 Essential (primary) hypertension: Secondary | ICD-10-CM

## 2012-08-29 DIAGNOSIS — R609 Edema, unspecified: Secondary | ICD-10-CM

## 2012-08-30 NOTE — Progress Notes (Signed)
Patient ID: [name], female   DOB: Mar 25, 1916, 79 y.o.   MRN: [id number]        PROGRESS NOTE  DATE:  08/29/2012  FACILITY: [facility]   LEVEL OF CARE: SNF  Routine Visit  CHIEF COMPLAINT:  Manage dementia and hypertension.   HISTORY OF PRESENT ILLNESS:  REASSESSMENT OF ONGOING PROBLEM(S):  DEMENTIA: The dementia is very advanced and the patient is a poor historian.  The dementia remains stable and continues to function adequately in the current living environment with supervision.  The patient has had little changes in behavior. No complications noted from the medications presently being used.   HTN: Pt 's HTN remains stable.  Staff deny CP, sob, DOE, pedal edema, headaches, dizziness or visual disturbances.  No complications from the medications currently being used.  Last BP :140/84, 140/80, 130/60, 126/68.  PAST MEDICAL HISTORY : Reviewed.  No changes.  CURRENT MEDICATIONS: Reviewed per [facility]  REVIEW OF SYSTEMS:  Unobtainable due to dementia.    PHYSICAL EXAMINATION  VS:  T 97      P 80     RR  18    BP 140/84    POX %     WT (Lb) 141  GENERAL: no acute distress, normal body habitus NECK: supple, trachea midline, no neck masses, no thyroid tenderness, no thyromegaly RESPIRATORY: breathing is even & unlabored, BS CTAB CARDIAC: RRR, no murmur,no extra heart sounds EDEMA/VARICOSITIES:  +2 bilateral lower extremity edema  ARTERIAL:  pedal pulses nonpalpable  GI: abdomen soft, normal BS, no masses, no tenderness, no hepatomegaly, no splenomegaly PSYCHIATRIC: the patient is alert & oriented to person, affect & behavior appropriate  LABS/RADIOLOGY: 4/14 glucose 129, creatinine 1.47 otherwise BMP normal, Depakote 40 06/2012:  Hemoglobin 10.2, MCV 93, otherwise CBC normal.   Hemoglobin A1C 6.    03/2012:  Creatinine 1.12, otherwise CMP normal.    ASSESSMENT/PLAN:  Hypertension.  Well controlled.    Dementia.  Advanced.   Lower extremity edema.  Stable.    Constipation.  No problems reported by the staff.    Coronary artery disease.  Stable.   Depression.  Continue Zoloft.    Chronic pain.  Currently on morphine sulfate.  The patient appears comfortable.    CPT CODE: 40981

## 2012-09-04 ENCOUNTER — Other Ambulatory Visit: Payer: Self-pay | Admitting: Geriatric Medicine

## 2012-09-04 MED ORDER — MORPHINE SULFATE 15 MG PO TABS: ORAL_TABLET | ORAL | Status: DC

## 2012-09-23 ENCOUNTER — Other Ambulatory Visit: Payer: Self-pay | Admitting: Geriatric Medicine

## 2012-09-23 MED ORDER — MORPHINE SULFATE 15 MG PO TABS: ORAL_TABLET | ORAL | Status: DC

## 2012-10-01 ENCOUNTER — Non-Acute Institutional Stay (SKILLED_NURSING_FACILITY): Payer: Medicare Other | Admitting: Internal Medicine

## 2012-10-01 DIAGNOSIS — K5900 Constipation, unspecified: Secondary | ICD-10-CM

## 2012-10-01 DIAGNOSIS — F068 Other specified mental disorders due to known physiological condition: Secondary | ICD-10-CM

## 2012-10-01 DIAGNOSIS — R609 Edema, unspecified: Secondary | ICD-10-CM | POA: Insufficient documentation

## 2012-10-01 DIAGNOSIS — I10 Essential (primary) hypertension: Secondary | ICD-10-CM

## 2012-10-01 NOTE — Progress Notes (Signed)
Patient ID: [name], female   DOB: [birth year], 79 y.o.   MRN: [id number]        PROGRESS NOTE  DATE:  10/01/2012  FACILITY: [facility]   LEVEL OF CARE: SNF  Routine Visit  CHIEF COMPLAINT:  Manage dementia and hypertension.   HISTORY OF PRESENT ILLNESS:  REASSESSMENT OF ONGOING PROBLEM(S):  DEMENTIA: The dementia is very advanced and the patient is a poor historian.  The dementia remains stable and continues to function adequately in the current living environment with supervision.  The patient has had little changes in behavior. No complications noted from the medications presently being used.   HTN: Pt 's HTN remains stable.  Staff deny CP, sob, DOE, headaches, dizziness or visual disturbances.  No complications from the medications currently being used.  Last BP :136/68,140/84, 140/80, 130/60, 126/68.  PAST MEDICAL HISTORY : Reviewed.  No changes.  CURRENT MEDICATIONS: Reviewed per [facility]  REVIEW OF SYSTEMS:  Unobtainable due to dementia.    PHYSICAL EXAMINATION  VS:  T 97.6      P 72     RR  18    BP 136/68    POX %     WT (Lb) 148  GENERAL: no acute distress, normal body habitus NECK: supple, trachea midline, no neck masses, no thyroid tenderness, no thyromegaly RESPIRATORY: breathing is even & unlabored, BS CTAB CARDIAC: RRR, no murmur,no extra heart sounds EDEMA/VARICOSITIES:  +2 bilateral lower extremity edema  ARTERIAL:  pedal pulses nonpalpable  GI: abdomen soft, normal BS, no masses, no tenderness, no hepatomegaly, no splenomegaly PSYCHIATRIC: the patient is alert & disoriented, affect & behavior appropriate  LABS/RADIOLOGY: 4/14 glucose 129, creatinine 1.47 otherwise BMP normal, Depakote 40 06/2012:  Hemoglobin 10.2, MCV 93, otherwise CBC normal.   Hemoglobin A1C 6.    03/2012:  Creatinine 1.12, otherwise CMP normal.    ASSESSMENT/PLAN:  Hypertension.  Well controlled.    Dementia.  Advanced. Risperdal increased.  Lower extremity edema.  Stable.    Constipation.  No problems reported by the staff.    Coronary artery disease.  Stable.   Depression.  Continue Zoloft.    Chronic pain.  Currently on morphine sulfate.  The patient appears comfortable.    Check liver profile.  CPT CODE: 40981

## 2012-10-15 ENCOUNTER — Non-Acute Institutional Stay: Payer: Medicare Other | Admitting: Internal Medicine

## 2012-10-15 DIAGNOSIS — N039 Chronic nephritic syndrome with unspecified morphologic changes: Secondary | ICD-10-CM

## 2012-10-15 DIAGNOSIS — D631 Anemia in chronic kidney disease: Secondary | ICD-10-CM

## 2012-10-15 DIAGNOSIS — N179 Acute kidney failure, unspecified: Secondary | ICD-10-CM

## 2012-10-20 ENCOUNTER — Non-Acute Institutional Stay (SKILLED_NURSING_FACILITY): Payer: Medicare Other | Admitting: Internal Medicine

## 2012-10-20 DIAGNOSIS — N179 Acute kidney failure, unspecified: Secondary | ICD-10-CM

## 2012-10-22 ENCOUNTER — Non-Acute Institutional Stay (SKILLED_NURSING_FACILITY): Payer: Medicare Other | Admitting: Internal Medicine

## 2012-10-24 ENCOUNTER — Other Ambulatory Visit: Payer: Self-pay | Admitting: *Deleted

## 2012-10-24 MED ORDER — MORPHINE SULFATE 15 MG PO TABS: ORAL_TABLET | ORAL | Status: DC

## 2012-11-05 ENCOUNTER — Non-Acute Institutional Stay (SKILLED_NURSING_FACILITY): Payer: Medicare Other | Admitting: Internal Medicine

## 2012-11-05 DIAGNOSIS — R609 Edema, unspecified: Secondary | ICD-10-CM

## 2012-11-05 DIAGNOSIS — N189 Chronic kidney disease, unspecified: Secondary | ICD-10-CM

## 2012-11-05 DIAGNOSIS — I150 Renovascular hypertension: Secondary | ICD-10-CM

## 2012-11-05 DIAGNOSIS — F068 Other specified mental disorders due to known physiological condition: Secondary | ICD-10-CM

## 2012-11-06 DIAGNOSIS — N179 Acute kidney failure, unspecified: Secondary | ICD-10-CM | POA: Insufficient documentation

## 2012-11-06 DIAGNOSIS — D631 Anemia in chronic kidney disease: Secondary | ICD-10-CM | POA: Insufficient documentation

## 2012-11-06 NOTE — Progress Notes (Signed)
Patient ID: [name], female   DOB: [birth year], 79 y.o.   MRN: [id number]        PROGRESS NOTE  DATE: 10/15/2012  FACILITY:  [facility]  LEVEL OF CARE: SNF (31)  Acute Visit  CHIEF COMPLAINT:  Manage acute renal failure and anemia of chronic kidney disease.    HISTORY OF PRESENT ILLNESS: I was requested by the staff to assess the patient regarding above problem(s):  ACUTE RENAL FAILURE:  On 10/15/2012:  BUN 43, creatinine 1.61.  On 10/08/2012:  BUN 35, creatinine 1.51.  Patient is on Lasix.  She does not follow commands due to advanced dementia.    Staff do not report increasing confusion or increasing lower extremity swelling.    ANEMIA: The anemia is unstable. The staff denies fatigue, melena or hematochezia. The patient is currently not on iron.  On 10/08/2012:  Hemoglobin 9.9, MCV 92.  In 06/2012:  Hemoglobin 10.2.    PAST MEDICAL HISTORY : Reviewed.  No changes.  CURRENT MEDICATIONS: Reviewed per [facility]  REVIEW OF SYSTEMS:  Unobtainable due to advanced dementia.   PHYSICAL EXAMINATION  GENERAL: no acute distress, moderately obese body habitus EYES: conjunctivae normal, sclerae normal, normal eye lids NECK: supple, trachea midline, no neck masses, no thyroid tenderness, no thyromegaly LYMPHATICS: no LAN in the neck, no supraclavicular LAN RESPIRATORY: breathing is even & unlabored, BS CTAB CARDIAC: RRR, no murmur,no extra heart sounds EDEMA/VARICOSITIES:  +2 bilateral lower extremity edema  ARTERIAL:  pedal pulses nonpalpable   GI: abdomen soft, normal BS, no masses, no tenderness, no hepatomegaly, no splenomegaly PSYCHIATRIC: the patient is alert, disoriented, affect & behavior appropriate  ASSESSMENT/PLAN:  Acute renal failure.  New onset.  Significant problem.  Discontinue Lasix and reassess on 10/20/2012.    Anemia of chronic kidney disease.  Unstable problem.  Hemoglobin declined.  We will monitor.    CPT CODE: 82956

## 2012-11-07 ENCOUNTER — Non-Acute Institutional Stay (SKILLED_NURSING_FACILITY): Payer: Medicare Other | Admitting: Internal Medicine

## 2012-11-07 DIAGNOSIS — N189 Chronic kidney disease, unspecified: Secondary | ICD-10-CM | POA: Insufficient documentation

## 2012-11-07 DIAGNOSIS — I150 Renovascular hypertension: Secondary | ICD-10-CM | POA: Insufficient documentation

## 2012-11-07 DIAGNOSIS — S81009A Unspecified open wound, unspecified knee, initial encounter: Secondary | ICD-10-CM

## 2012-11-07 DIAGNOSIS — S91002A Unspecified open wound, left ankle, initial encounter: Secondary | ICD-10-CM

## 2012-11-07 DIAGNOSIS — S81002A Unspecified open wound, left knee, initial encounter: Secondary | ICD-10-CM

## 2012-11-07 NOTE — Progress Notes (Signed)
Patient ID: [name], female   DOB: 08-Jun-1915, 79 y.o.   MRN: [id number]        PROGRESS NOTE  DATE:  11/05/2012  [facility]   LEVEL OF CARE: SNF  Routine Visit  CHIEF COMPLAINT:  Manage CKD, dementia and hypertension.   HISTORY OF PRESENT ILLNESS:  REASSESSMENT OF ONGOING PROBLEM(S):  DEMENTIA: The dementia is very advanced and the patient is a poor historian.  The dementia remains stable and continues to function adequately in the current living environment with supervision.  The patient has had little changes in behavior. No complications noted from the medications presently being used.   Advanced.  Pt is a poor historian.  HTN: Pt 's HTN remains stable.  Staff deny CP, sob, DOE, headaches, dizziness or visual disturbances.  No complications from the medications currently being used.  Last BP : 140/90,136/68,140/84, 140/80, 130/60, 126/68.  CHRONIC KIDNEY DISEASE: The patient's chronic kidney disease remains stable.  staff deny increasing lower extremity swelling or confusion. Last BUN and creatinine are: 22/1.4 on 6/30 & 43/1.61 on 6/18.  PAST MEDICAL HISTORY : Reviewed.  No changes.  CURRENT MEDICATIONS: Reviewed per [facility]  REVIEW OF SYSTEMS:  Unobtainable due to dementia.    PHYSICAL EXAMINATION  VS:  T 98.9      P 60    RR  16    BP 140/90    POX %     WT (Lb) 151  GENERAL: no acute distress, moderately obese body habitus EYES: normal sclerae, normal conjunctivae, no discharge NECK: supple, trachea midline, no neck masses, no thyroid tenderness, no thyromegaly LYMPHATICS: no cervical LAN, no supraclavicular LAN RESPIRATORY: breathing is even & unlabored, BS CTAB CARDIAC: RRR, no murmur,no extra heart sounds EDEMA/VARICOSITIES:  +2 bilateral lower extremity edema  ARTERIAL:  pedal pulses nonpalpable  GI: abdomen soft, normal BS, no masses, no tenderness, no hepatomegaly, no splenomegaly PSYCHIATRIC: the patient is alert & disoriented, affect & behavior  appropriate  LABS/RADIOLOGY:  10/27/12 bun 22, cr 1.14 10/15/12 bun 43, cr 1.61 ow bmp nl 10/09/12 Hb 9.9, mcv 92 ow cbc nl, HDL 39 ow FLP nl, tp 5.6, alb 3.4 ow liver profile nl, HbA1c 6.1 4/14 glucose 129, creatinine 1.47 otherwise BMP normal, Depakote 40 06/2012:  Hemoglobin 10.2, MCV 93, otherwise CBC normal.   Hemoglobin A1C 6.    03/2012:  Creatinine 1.12, otherwise CMP normal.    ASSESSMENT/PLAN:  Hypertension.  Uncontrolled.  Start clonidine patch 0.1mg  qwkly.  Dementia.  Advanced.  CKD-renal fcns improved.  Lower extremity edema.  Stable.   Constipation.  No problems reported by the staff.    Coronary artery disease.  Stable.   Depression.  Continue Zoloft.    Chronic pain.  Currently on morphine sulfate.  The patient appears comfortable.    CPT CODE: 78295

## 2012-11-13 NOTE — Progress Notes (Signed)
Patient ID: [name], female   DOB: 21-Nov-1915, 79 y.o.   MRN: [id number]        PROGRESS NOTE  DATE: 10/20/2012  FACILITY:  [facility]  LEVEL OF CARE: SNF (31)  Acute Visit  CHIEF COMPLAINT:  Manage acute renal failure.    HISTORY OF PRESENT ILLNESS: I was requested by the staff to assess the patient regarding above problem(s):  On 10/15/2012:  Patient's BUN was 43, creatinine 1.61.  On 10/08/2012:  BUN 35, creatinine 1.51.  Her Lasix was discontinued.   In April 2014:  BUN 34, creatinine 1.47.  Patient does not follow commands due to advanced dementia.  Staff do not report increasing confusion or increasing lower extremity swelling.    PAST MEDICAL HISTORY : Reviewed.  No changes.  CURRENT MEDICATIONS: Reviewed per [facility]  REVIEW OF SYSTEMS:  Unobtainable due to dementia.    PHYSICAL EXAMINATION  GENERAL: no acute distress, moderately obese body habitus NECK: supple, trachea midline, no neck masses, no thyroid tenderness, no thyromegaly RESPIRATORY: breathing is even & unlabored, BS CTAB CARDIAC: RRR, no murmur,no extra heart sounds EDEMA/VARICOSITIES:  +2 bilateral lower extremity edema  ARTERIAL:  pedal pulses nonpalpable   GI: abdomen soft, normal BS, no masses, no tenderness, no hepatomegaly, no splenomegaly PSYCHIATRIC: the patient is alert, disoriented, affect & behavior appropriate  ASSESSMENT/PLAN:  Acute renal failure.  Worsening problem.  Lasix was discontinued.  She is not on any other renal toxic medications.  Therefore, reassess on 10/27/2012.    CPT CODE: 40981

## 2012-11-15 ENCOUNTER — Non-Acute Institutional Stay (SKILLED_NURSING_FACILITY): Payer: Medicare Other | Admitting: Internal Medicine

## 2012-11-15 DIAGNOSIS — IMO0002 Reserved for concepts with insufficient information to code with codable children: Secondary | ICD-10-CM

## 2012-11-15 DIAGNOSIS — S81002S Unspecified open wound, left knee, sequela: Secondary | ICD-10-CM

## 2012-11-24 ENCOUNTER — Non-Acute Institutional Stay (SKILLED_NURSING_FACILITY): Payer: Medicare Other | Admitting: Adult Health

## 2012-11-24 DIAGNOSIS — J189 Pneumonia, unspecified organism: Secondary | ICD-10-CM

## 2012-11-24 NOTE — Progress Notes (Signed)
Patient ID: [name], female   DOB: 1916-03-26, 79 y.o.   MRN: [id number]        PROGRESS NOTE  DATE: 10/22/2012  FACILITY:  [facility]  LEVEL OF CARE: SNF (31)  Acute Visit  CHIEF COMPLAINT:  Manage chronic kidney disease stage III.    HISTORY OF PRESENT ILLNESS: I was requested by the staff to assess the patient regarding above problem(s):  CHRONIC KIDNEY DISEASE: The patient's chronic kidney disease remains stable.  Staff denies increasing lower extremity swelling or confusion. Last BUN and creatinine are:   On 10/20/2012:  BUN 28, creatinine 1.38.  On 10/15/2012:  BUN 43, creatinine 1.61.  Patient is a poor historian due to dementia.    PAST MEDICAL HISTORY : Reviewed.  No changes.  CURRENT MEDICATIONS: Reviewed per [facility]  PHYSICAL EXAMINATION  GENERAL: no acute distress, moderately obese body habitus RESPIRATORY: breathing is even & unlabored, BS CTAB CARDIAC: RRR, no murmur,no extra heart sounds EDEMA/VARICOSITIES:  +2 bilateral lower extremity edema  ARTERIAL:  pedal pulses nonpalpable    ASSESSMENT/PLAN:  Chronic kidney disease stage III.  Renal functions improved.    CPT CODE: 40981

## 2012-11-25 NOTE — Progress Notes (Shared)
Patient ID: [name], female   DOB: 1916-02-16, 79 y.o.   MRN: [id number]           PROGRESS NOTE  DATE:  11/07/2012  FACILITY: [place]  LEVEL OF CARE:   SNF   Acute Visit    CHIEF COMPLAINT:  Review of left lower extremity wounds.   HISTORY OF PRESENT ILLNESS:  This is a 79 year-old woman with a known history of coronary artery disease, chronic renal failure, carotid stenosis.    She has been noted over the last three weeks to have wounds on her left calcaneus.  She has also had significant edema of her left leg.  Duplex ultrasound of the left leg showed no evidence of DVT.    LABORATORY DATA:   Recent lab work showed a creatinine of 1.51, hemoglobin of 9.9.  Her albumin was 3.4.  LDL cholesterol is 76.    PHYSICAL EXAMINATION:   CIRCULATION:  EDEMA/VARICOSITIES:  Left leg:  There is indeed significant edema; however, not a lot of warmth.   ARTERIAL:  Peripheral pulses are impossible to feel at the dorsalis pedis or posterior tibial, although her cap refill seems reasonable.   SKIN:  INSPECTION:  There is a small wound on the medial of the right heel with a covering eschar.  There is a substantial wound, although still about the size of a quarter, on the lateral aspect of the left heel.  Both of these look "punched out" and have the appearance of an arterial insufficiency wound, although she does not exactly appear to carry that diagnosis.     ASSESSMENT/PLAN:  Probable arterial insufficiency wound involving the left leg.  We do have a note from 2011, a discharge summary from the hospital, noting PAD.  She is certainly at risk for this.  These wounds are going to need to be dressed with Santyl.  This may need debridement.  She would not be a very good candidate for revascularization at this stage.    Edema of the left leg.  She has no clear evidence of heart failure here.  She does have renal insufficiency and is on Norvasc.  She is not on a diuretic.    CPT CODE:  11914

## 2012-11-28 ENCOUNTER — Other Ambulatory Visit: Payer: Self-pay | Admitting: Geriatric Medicine

## 2012-11-28 NOTE — Progress Notes (Signed)
Patient ID: [name], female   DOB: 01/01/1916, 79 y.o.   MRN: [id number]           PROGRESS NOTE  DATE:  11/14/2012  [facility]   LEVEL OF CARE:   SNF   Acute Visit   CHIEF COMPLAINT:  Re-review of wounds on the left leg, peripheral vascular disease.      HISTORY OF PRESENT ILLNESS:  I saw [name] on 11/07/2012.  She has wounds to the medial and lateral aspect of her calcaneus.  Both of these are covered with an adherent eschar.  We used Santyl and an occlusive dressing.  We used Kerlix and a light Coban.  I ordered ABIs on her that came back showing right ankle brachial index of 0.47 and left ankle brachial index of 0.28.  There is some suggestion from some of the staff that we went through this two years ago, the family opting for no surgery.  Miraculously, the areas healed.     PHYSICAL EXAMINATION:   CIRCULATION:   EDEMA/VARICOSITIES:  Left foot:  There is edema, probably some degree of venous stasis.   ARTERIAL:  I cannot feel a popliteal or a femoral pulse.  SKIN:  INSPECTION:  The wounds in question are over the medial and lateral aspect of the heel.  The forefoot is cool.    ASSESSMENT/PLAN:  Ischemic wounds of the left heel, as noted above.  She has severe PAD.  She would not be a candidate for bypass surgery given advanced cognitive issues.  Hopefully, we can keep these clean and dry.  She is complaining of pain.  Certainly, it is likely that these are painful.  I will leave her analgesics if she does not already have them.  Also, given the severity of the disease on the left, I really do not think she can tolerate Coban.  We will just use a simple Kerlix wrap.    CPT CODE: 40981

## 2012-12-02 ENCOUNTER — Other Ambulatory Visit: Payer: Self-pay | Admitting: Geriatric Medicine

## 2012-12-03 ENCOUNTER — Non-Acute Institutional Stay (SKILLED_NURSING_FACILITY): Payer: Medicare Other | Admitting: Internal Medicine

## 2012-12-03 DIAGNOSIS — R609 Edema, unspecified: Secondary | ICD-10-CM

## 2012-12-03 DIAGNOSIS — F068 Other specified mental disorders due to known physiological condition: Secondary | ICD-10-CM

## 2012-12-03 DIAGNOSIS — N189 Chronic kidney disease, unspecified: Secondary | ICD-10-CM

## 2012-12-03 DIAGNOSIS — I150 Renovascular hypertension: Secondary | ICD-10-CM

## 2012-12-03 NOTE — Progress Notes (Signed)
Patient ID: [name], female   DOB: [birth year], 79 y.o.   MRN: [id number]        PROGRESS NOTE  DATE:  12/03/2012  [facility]   LEVEL OF CARE: SNF  Routine Visit  CHIEF COMPLAINT:  Manage CKD, dementia and hypertension.   HISTORY OF PRESENT ILLNESS:  REASSESSMENT OF ONGOING PROBLEM(S):  DEMENTIA: The dementia is very advanced and the patient is a poor historian.  The dementia remains stable and continues to function adequately in the current living environment with supervision.  The patient has had little changes in behavior. No complications noted from the medications presently being used.   Advanced.  Pt is a poor historian.  HTN: Pt 's HTN remains stable.  Staff deny CP, sob, DOE, headaches, dizziness or visual disturbances.  No complications from the medications currently being used.  Last BP : 140/90,136/68,140/84, 140/80, 130/60, 126/68,108/88  CHRONIC KIDNEY DISEASE: The patient's chronic kidney disease remains stable.  staff deny increasing lower extremity swelling or confusion. Last BUN and creatinine are: 22/1.4 on 10/27/12 & 43/1.61 on 10/15/12.  PAST MEDICAL HISTORY : Reviewed.  No changes.  CURRENT MEDICATIONS: Reviewed per [facility]  REVIEW OF SYSTEMS:  Unobtainable due to dementia.    PHYSICAL EXAMINATION  VS:  T 98.7      P 78    RR 20    BP 108/88    POX %     WT (Lb) 151  GENERAL: no acute distress, moderately obese body habitus NECK: supple, trachea midline, no neck masses, no thyroid tenderness, no thyromegaly RESPIRATORY: breathing is even & unlabored, BS CTAB CARDIAC: RRR, no murmur,no extra heart sounds EDEMA/VARICOSITIES:  +2 bilateral lower extremity edema  ARTERIAL:  pedal pulses nonpalpable  GI: abdomen soft, normal BS, no masses, no tenderness, no hepatomegaly, no splenomegaly PSYCHIATRIC: the patient is alert & disoriented, affect & behavior appropriate  LABS/RADIOLOGY:  10/27/12 bun 22, cr 1.14 10/15/12 bun 43, cr 1.61 ow bmp nl 10/09/12  Hb 9.9, mcv 92 ow cbc nl, HDL 39 ow FLP nl, tp 5.6, alb 3.4 ow liver profile nl, HbA1c 6.1 4/14 glucose 129, creatinine 1.47 otherwise BMP normal, Depakote 40 06/2012:  Hemoglobin 10.2, MCV 93, otherwise CBC normal.   Hemoglobin A1C 6.    03/2012:  Creatinine 1.12, otherwise CMP normal.    ASSESSMENT/PLAN:  Hypertension.  BP improved.  Dementia.  Advanced.  CKD-renal fcns improved.  Lower extremity edema.  Stable.   Constipation.  No problems reported by the staff.    Coronary artery disease.  Stable.   Depression.  Continue Zoloft.    Chronic pain.  Currently on morphine sulfate.  The patient appears comfortable.    CPT CODE: 40981

## 2012-12-22 ENCOUNTER — Other Ambulatory Visit: Payer: Self-pay

## 2012-12-22 MED ORDER — MORPHINE SULFATE 15 MG PO TABS: ORAL_TABLET | ORAL | Status: DC

## 2013-01-02 ENCOUNTER — Non-Acute Institutional Stay (SKILLED_NURSING_FACILITY): Payer: Medicare Other | Admitting: Internal Medicine

## 2013-01-02 DIAGNOSIS — R609 Edema, unspecified: Secondary | ICD-10-CM

## 2013-01-02 DIAGNOSIS — I150 Renovascular hypertension: Secondary | ICD-10-CM

## 2013-01-02 DIAGNOSIS — F068 Other specified mental disorders due to known physiological condition: Secondary | ICD-10-CM

## 2013-01-04 NOTE — Progress Notes (Signed)
Patient ID: [name], female   DOB: 1915-10-21, 79 y.o.   MRN: [id number]        PROGRESS NOTE  DATE:  01/02/2013  [facility]   LEVEL OF CARE: SNF  Routine Visit  CHIEF COMPLAINT:  Manage CKD, dementia and hypertension.   HISTORY OF PRESENT ILLNESS:  REASSESSMENT OF ONGOING PROBLEM(S):  DEMENTIA: The dementia is very advanced and the patient is a poor historian.  The dementia remains stable and continues to function adequately in the current living environment with supervision.  The patient has had little changes in behavior. No complications noted from the medications presently being used.   Advanced.  Pt is a poor historian.  HTN: Pt 's HTN remains stable.  Staff deny CP, sob, DOE, headaches, dizziness or visual disturbances.  No complications from the medications currently being used.  Last BP : 140/90,136/68,140/84, 140/80, 130/60, 126/68,108/88, 142/82  CHRONIC KIDNEY DISEASE: The patient's chronic kidney disease remains stable.  staff deny increasing lower extremity swelling or confusion. Last BUN and creatinine are: 22/1.4 on 10/27/12 & 43/1.61 on 10/15/12.  PAST MEDICAL HISTORY : Reviewed.  No changes.  CURRENT MEDICATIONS: Reviewed per [facility]  REVIEW OF SYSTEMS:  Unobtainable due to dementia.    PHYSICAL EXAMINATION  VS:  T 98.      P 82    RR 18    BP 142/82    POX %     WT (Lb) 179  GENERAL: no acute distress, moderately obese body habitus NECK: supple, trachea midline, no neck masses, no thyroid tenderness, no thyromegaly RESPIRATORY: breathing is even & unlabored, BS CTAB CARDIAC: RRR, no murmur,no extra heart sounds EDEMA/VARICOSITIES:  +2 bilateral lower extremity edema  ARTERIAL:  pedal pulses nonpalpable  GI: abdomen soft, normal BS, no masses, no tenderness, no hepatomegaly, no splenomegaly PSYCHIATRIC: the patient is alert & disoriented, affect & behavior appropriate  LABS/RADIOLOGY:  10/27/12 bun 22, cr 1.14 10/15/12 bun 43, cr 1.61 ow bmp  nl 10/09/12 Hb 9.9, mcv 92 ow cbc nl, HDL 39 ow FLP nl, tp 5.6, alb 3.4 ow liver profile nl, HbA1c 6.1 4/14 glucose 129, creatinine 1.47 otherwise BMP normal, Depakote 40 06/2012:  Hemoglobin 10.2, MCV 93, otherwise CBC normal.   Hemoglobin A1C 6.    03/2012:  Creatinine 1.12, otherwise CMP normal.    ASSESSMENT/PLAN:  Hypertension.  BP borderline.  Monitor.  Dementia.  Advanced.  CKD-renal fcns improved.  Lower extremity edema.  Stable.   Constipation.  No problems reported by the staff.    Coronary artery disease.  Stable.   Depression.  Continue Zoloft.    Chronic pain.  Currently on morphine sulfate.  The patient appears comfortable.    CPT CODE: 40981

## 2013-01-26 ENCOUNTER — Other Ambulatory Visit: Payer: Self-pay | Admitting: *Deleted

## 2013-01-26 MED ORDER — MORPHINE SULFATE 15 MG PO TABS: ORAL_TABLET | ORAL | Status: DC

## 2013-02-05 ENCOUNTER — Non-Acute Institutional Stay (SKILLED_NURSING_FACILITY): Payer: Medicare Other | Admitting: Internal Medicine

## 2013-02-05 DIAGNOSIS — N183 Chronic kidney disease, stage 3 unspecified: Secondary | ICD-10-CM

## 2013-02-05 DIAGNOSIS — I150 Renovascular hypertension: Secondary | ICD-10-CM

## 2013-02-05 DIAGNOSIS — F068 Other specified mental disorders due to known physiological condition: Secondary | ICD-10-CM

## 2013-02-05 DIAGNOSIS — R609 Edema, unspecified: Secondary | ICD-10-CM

## 2013-02-13 NOTE — Progress Notes (Signed)
Patient ID: [name], female   DOB: [birth year], 79 y.o.   MRN: [id number]        PROGRESS NOTE  DATE:  02/05/2013  [facility]   LEVEL OF CARE: SNF  Routine Visit  CHIEF COMPLAINT:  Manage CKD, dementia and hypertension.   HISTORY OF PRESENT ILLNESS:  REASSESSMENT OF ONGOING PROBLEM(S):  DEMENTIA: The dementia is very advanced and the patient is a poor historian.  The dementia remains stable and continues to function adequately in the current living environment with supervision.  The patient has had little changes in behavior. No complications noted from the medications presently being used.   Advanced.  Pt is a poor historian.  HTN: Pt 's HTN remains stable.  Staff deny CP, sob, DOE, headaches, dizziness or visual disturbances.  No complications from the medications currently being used.  Last BP : 140/90,136/68,140/84, 140/80, 130/60, 126/68,108/88, 142/82, 140/86  CHRONIC KIDNEY DISEASE: The patient's chronic kidney disease remains stable.  staff deny increasing lower extremity swelling or confusion. Last BUN and creatinine are: 22/1.4 on 10/27/12 & 43/1.61 on 10/15/12.  PAST MEDICAL HISTORY : Reviewed.  No changes.  CURRENT MEDICATIONS: Reviewed per [facility]  REVIEW OF SYSTEMS:  Unobtainable due to dementia.    PHYSICAL EXAMINATION  VS:  T 98.3      P 60    RR 20    BP 140/86    POX %     WT (Lb) 178  GENERAL: no acute distress, moderately obese body habitus EYES: nl sclerae, nl conjunctivae, no discharge NECK: supple, trachea midline, no neck masses, no thyroid tenderness, no thyromegaly LYMPHATICS: no cervical LAN, no supraclavicular LAN RESPIRATORY: breathing is even & unlabored, BS CTAB CARDIAC: RRR, no murmur,no extra heart sounds EDEMA/VARICOSITIES:  +2 bilateral lower extremity edema  ARTERIAL:  pedal pulses nonpalpable  GI: abdomen soft, normal BS, no masses, no tenderness, no hepatomegaly, no splenomegaly PSYCHIATRIC: the patient is alert & disoriented,  affect & behavior appropriate  LABS/RADIOLOGY:  10/27/12 bun 22, cr 1.14 10/15/12 bun 43, cr 1.61 ow bmp nl 10/09/12 Hb 9.9, mcv 92 ow cbc nl, HDL 39 ow FLP nl, tp 5.6, alb 3.4 ow liver profile nl, HbA1c 6.1 4/14 glucose 129, creatinine 1.47 otherwise BMP normal, Depakote 40 06/2012:  Hemoglobin 10.2, MCV 93, otherwise CBC normal.   Hemoglobin A1C 6.    03/2012:  Creatinine 1.12, otherwise CMP normal.    ASSESSMENT/PLAN:  Hypertension. Uncontrolled.  Increase hydralazine to 15 mg tid.  Dementia.  Advanced.  CKD-renal fcns improved.  Lower extremity edema.  Stable.   Constipation.  No problems reported by the staff.    Coronary artery disease.  Stable.   Depression.  Continue Zoloft.    Chronic pain.  Currently on morphine sulfate.  The patient appears comfortable.    CPT CODE: 40981

## 2013-02-20 ENCOUNTER — Other Ambulatory Visit: Payer: Self-pay | Admitting: *Deleted

## 2013-02-20 MED ORDER — MORPHINE SULFATE 15 MG PO TABS: ORAL_TABLET | ORAL | Status: DC

## 2013-02-24 ENCOUNTER — Non-Acute Institutional Stay (SKILLED_NURSING_FACILITY): Payer: Medicare Other | Admitting: Internal Medicine

## 2013-02-24 DIAGNOSIS — R05 Cough: Secondary | ICD-10-CM

## 2013-03-05 ENCOUNTER — Non-Acute Institutional Stay (SKILLED_NURSING_FACILITY): Payer: Medicare Other | Admitting: Internal Medicine

## 2013-03-05 DIAGNOSIS — I150 Renovascular hypertension: Secondary | ICD-10-CM

## 2013-03-05 DIAGNOSIS — R609 Edema, unspecified: Secondary | ICD-10-CM

## 2013-03-05 DIAGNOSIS — F068 Other specified mental disorders due to known physiological condition: Secondary | ICD-10-CM

## 2013-03-06 NOTE — Progress Notes (Signed)
Patient ID: [name], female   DOB: [birth year], 79 y.o.   MRN: [id number]        PROGRESS NOTE  DATE:  03/05/2013  FAC[facility]: [facility]   LEVEL OF CARE: SNF  Routine Visit  CHIEF COMPLAINT:  Manage CKD, dementia and hypertension.   HISTORY OF PRESENT ILLNESS:  REASSESSMENT OF ONGOING PROBLEM(S):  DEMENTIA: The dementia is very advanced and the patient is a poor historian.  The dementia remains stable and continues to function adequately in the current living environment with supervision.  The patient has had little changes in behavior. No complications noted from the medications presently being used.   Advanced.  Pt is a poor historian.  HTN: Pt 's HTN remains stable.  Staff deny CP, sob, DOE, headaches, dizziness or visual disturbances.  No complications from the medications currently being used.  Last BP : 140/90,136/68,140/84, 140/80, 130/60, 126/68,108/88, 142/82, 140/86, 136/86  CHRONIC KIDNEY DISEASE: The patient's chronic kidney disease remains stable.  staff deny increasing lower extremity swelling or confusion. Last BUN and creatinine are: 22/1.4 on 10/27/12 & 43/1.61 on 10/15/12, in 7-14 bun 22, cr 1.14  PAST MEDICAL HISTORY : Reviewed.  No changes.  CURRENT MEDICATIONS: Reviewed per [facility]  REVIEW OF SYSTEMS:  Unobtainable due to dementia.    PHYSICAL EXAMINATION  VS:  T 98.3      P 62    RR 16    BP 136/86    POX %     WT (Lb) 178  GENERAL: no acute distress, moderately obese body habitus NECK: supple, trachea midline, no neck masses, no thyroid tenderness, no thyromegaly RESPIRATORY: breathing is even & unlabored, BS CTAB CARDIAC: RRR, no murmur,no extra heart sounds EDEMA/VARICOSITIES:  +2 bilateral lower extremity edema  ARTERIAL:  pedal pulses nonpalpable  GI: abdomen soft, normal BS, no masses, no tenderness, no hepatomegaly, no splenomegaly PSYCHIATRIC: the patient is alert & disoriented, affect & behavior appropriate  LABS/RADIOLOGY:  10-14 depakote level  29  10/27/12 bun 22, cr 1.14 10/15/12 bun 43, cr 1.61 ow bmp nl 10/09/12 Hb 9.9, mcv 92 ow cbc nl, HDL 39 ow FLP nl, tp 5.6, alb 3.4 ow liver profile nl, HbA1c 6.1 4/14 glucose 129, creatinine 1.47 otherwise BMP normal, Depakote 40 06/2012:  Hemoglobin 10.2, MCV 93, otherwise CBC normal.   Hemoglobin A1C 6.    03/2012:  Creatinine 1.12, otherwise CMP normal.    ASSESSMENT/PLAN:  Hypertension. Improved.  Dementia.  Advanced.  CKD-renal fcns improved.  Lower extremity edema.  Stable.   Constipation.  No problems reported by the staff.    Coronary artery disease.  Stable.   Depression.  Zoloft was increased.    Chronic pain.  Currently on morphine sulfate.  The patient appears comfortable.    Check depakote level.  CPT CODE: [id number]

## 2013-03-10 ENCOUNTER — Other Ambulatory Visit: Payer: Self-pay | Admitting: *Deleted

## 2013-03-10 MED ORDER — AMBULATORY NON FORMULARY MEDICATION: Status: DC

## 2013-03-20 DIAGNOSIS — R05 Cough: Secondary | ICD-10-CM | POA: Insufficient documentation

## 2013-03-20 NOTE — Progress Notes (Signed)
Patient ID: [name], female   DOB: [birth year], 79 y.o.   MRN: [id number]        PROGRESS NOTE  DATE: 02/24/2013  FACILITY:  [facility]  LEVEL OF CARE: SNF (31)  Acute Visit  CHIEF COMPLAINT:  Manage cough.    HISTORY OF PRESENT ILLNESS: I was requested by the staff to assess the patient regarding above problem(s):  Staff report that patient is having a cough productive of yellow mucus.  She is sleeping during meals and also did not consume much breakfast.  Breakfast intake was less than 25%.  Staff do not report respiratory insufficiency.  Patient is a poor historian due to dementia.    PAST MEDICAL HISTORY : Reviewed.  No changes.  CURRENT MEDICATIONS: Reviewed per [facility]  REVIEW OF SYSTEMS:  Unobtainable due to dementia.    PHYSICAL EXAMINATION  GENERAL: no acute distress, morbidly obese body habitus NECK: supple, trachea midline, no neck masses, no thyroid tenderness, no thyromegaly LYMPHATICS: no LAN in the neck, no supraclavicular LAN RESPIRATORY: decreased breath sounds, but no wheezing or crackles   CARDIAC: RRR, no murmur,no extra heart sounds    EDEMA/VARICOSITIES:  +2 bilateral lower extremity edema  ARTERIAL:  pedal pulses nonpalpable   GI: abdomen soft, normal BS, no masses, no tenderness, no hepatomegaly, no splenomegaly  ASSESSMENT/PLAN:  Cough.  New problem.  Obtain chest x-ray.  Start Mucinex 600 mg b.i.d. for one week.    CPT CODE: 40981

## 2013-03-24 ENCOUNTER — Other Ambulatory Visit: Payer: Self-pay

## 2013-03-24 MED ORDER — MORPHINE SULFATE 15 MG PO TABS: ORAL_TABLET | ORAL | Status: DC

## 2013-04-02 ENCOUNTER — Non-Acute Institutional Stay (SKILLED_NURSING_FACILITY): Payer: Medicare Other | Admitting: Internal Medicine

## 2013-04-02 DIAGNOSIS — F068 Other specified mental disorders due to known physiological condition: Secondary | ICD-10-CM

## 2013-04-02 DIAGNOSIS — I150 Renovascular hypertension: Secondary | ICD-10-CM

## 2013-04-02 DIAGNOSIS — R609 Edema, unspecified: Secondary | ICD-10-CM

## 2013-04-03 ENCOUNTER — Encounter: Payer: Self-pay | Admitting: Internal Medicine

## 2013-04-03 NOTE — Progress Notes (Signed)
Patient ID: [name], female   DOB: 06-08-1915, 79 y.o.   MRN: [id number]        PROGRESS NOTE  DATE:  04/02/2013  [facility]   LEVEL OF CARE: SNF  Routine Visit  CHIEF COMPLAINT:  Manage CKD, dementia and hypertension.   HISTORY OF PRESENT ILLNESS:  REASSESSMENT OF ONGOING PROBLEM(S):  DEMENTIA: The dementia is very advanced and the patient is a poor historian.  The dementia remains stable and continues to function adequately in the current living environment with supervision.  The patient has had little changes in behavior. No complications noted from the medications presently being used.   Advanced.  Pt is a poor historian.  HTN: Pt 's HTN remains stable.  Staff deny CP, sob, DOE, headaches, dizziness or visual disturbances.  No complications from the medications currently being used.  Last BP : 140/90,136/68,140/84, 140/80, 130/60, 126/68,108/88, 142/82, 140/86, 136/86  CHRONIC KIDNEY DISEASE: The patient's chronic kidney disease remains stable.  staff deny increasing lower extremity swelling or confusion. Last BUN and creatinine are: 22/1.4 on 10/27/12 & 43/1.61 on 10/15/12, in 7-14 bun 22, cr 1.14  PAST MEDICAL HISTORY : Reviewed.  No changes.  CURRENT MEDICATIONS: Reviewed per [facility]  REVIEW OF SYSTEMS:  Unobtainable due to dementia.    PHYSICAL EXAMINATION  VS:  T 98.3      P 62    RR 16    BP 136/86    POX %     WT (Lb) 178  GENERAL: no acute distress, moderately obese body habitus NECK: supple, trachea midline, no neck masses, no thyroid tenderness, no thyromegaly RESPIRATORY: breathing is even & unlabored, BS CTAB CARDIAC: RRR, no murmur,no extra heart sounds EDEMA/VARICOSITIES:  +2 bilateral lower extremity edema  ARTERIAL:  pedal pulses nonpalpable  GI: abdomen soft, normal BS, no masses, no tenderness, no hepatomegaly, no splenomegaly PSYCHIATRIC: the patient is alert & disoriented, affect & behavior appropriate  LABS/RADIOLOGY:  11- 14 Depakote  level 32  10-14 depakote level 29  10/27/12 bun 22, cr 1.14 10/15/12 bun 43, cr 1.61 ow bmp nl 10/09/12 Hb 9.9, mcv 92 ow cbc nl, HDL 39 ow FLP nl, tp 5.6, alb 3.4 ow liver profile nl, HbA1c 6.1 4/14 glucose 129, creatinine 1.47 otherwise BMP normal, Depakote 40 06/2012:  Hemoglobin 10.2, MCV 93, otherwise CBC normal.   Hemoglobin A1C 6.    03/2012:  Creatinine 1.12, otherwise CMP normal.    ASSESSMENT/PLAN:  Hypertension. Improved.  Dementia.  Advanced.  CKD-renal fcns improved. Check creatinine  Lower extremity edema.  Stable.   Constipation.  No problems reported by the staff.    Coronary artery disease.  Stable.   Depression.  Zoloft was increased.    Chronic pain.  Currently on morphine sulfate.  The patient appears comfortable.    Check CBC and CMP  CPT CODE: 19147

## 2013-04-08 NOTE — Progress Notes (Signed)
Patient ID: [name], female   DOB: Feb 13, 1916, 79 y.o.   MRN: [id number]     [facility]  No Known Allergies  Chief Complaint  Patient presents with  . Acute Visit    respiratory infection     HPI   She is being seen for her cough and congestion. She has wheezing present. There are no reports of fever present. I am told her appetite is not as good as it usually is. She is unable to fully participate in the hpi or ros.   Past Medical History  Diagnosis Date  . HTN (hypertension)   . Sick sinus syndrome     s/p PPM, PPM generator change 01/2004 and 12/13  . Dementia     h/o aggressive behavior  . Chronic renal insufficiency   . PVD (peripheral vascular disease)   . Normocytic anemia   ' Past Surgical History  Procedure Laterality Date  . Pacemaker insertion  07/31/1991  . Pacemaker generator change  04/15/12    MDT Sensia generator change by Dr Geni Bers Vitals:   11/24/12 1455  BP: 127/79  Pulse: 82  Height: 5\' 3"  (1.6 m)  Weight: 151 lb (68.493 kg)    MEDICATIONS  Clonidine patch 0.1 weekly risperdal 0.5 mg daily Asa 81 mg daily mvi daily norvasc 10 mg daily Ca++ 500 mg daily exelon patch 0.5 mg daily zoloft 200 mg daily miralax daily namenda xr 14 mg daily depakote sprinkles 375 mg twice daily and 250 mg at 12p Lactulose 30 cc bid Lopressor 100 mg twice daily Apresoline 10 mg tid Senna s 2 tabs daily Ativan 1 mg daily and twice daily prn   LABS REVIEWED:  07-21-12: hgb a1c 6.0 10-09-12: wbc 6.2; hgb 9.9; hct 30.5; mcv 92; plt 186; glucose 75; bun 35; creat 1.51; k+5.2; na++143; liver normal albumin 3.; chol 128; ldl 76; trig 64 10-15-12: glucose 90; bun 43; creat 1.61; k+4.8; na++ 143 10-28-12; bun 22; creat 1.14   Review of Systems  Unable to perform ROS   Physical Exam  Constitutional: She appears well-developed and well-nourished. No distress.  Neck: Neck supple. No JVD present.  Cardiovascular: Normal rate, regular rhythm and  intact distal pulses.   Respiratory: No respiratory distress. She has wheezes. She has rales.  GI: Soft. Bowel sounds are normal. She exhibits no distension.  Musculoskeletal: She exhibits no edema.  Lymphadenopathy:    She has no cervical adenopathy.  Neurological: She is alert.  Skin: Skin is warm and dry. She is not diaphoretic.    ASSESSMENT/PLAN  1. Pneumonia: will begin her on augmentin 875 mg twice daily for 10 days; with florastor twice dialy for 10 days. Will begin robitussin 20 cc every 6 hours for 10 days and will monitor her status

## 2013-04-09 ENCOUNTER — Non-Acute Institutional Stay (SKILLED_NURSING_FACILITY): Payer: Medicare Other | Admitting: Internal Medicine

## 2013-04-09 DIAGNOSIS — D631 Anemia in chronic kidney disease: Secondary | ICD-10-CM

## 2013-04-09 DIAGNOSIS — E875 Hyperkalemia: Secondary | ICD-10-CM

## 2013-04-16 ENCOUNTER — Non-Acute Institutional Stay (SKILLED_NURSING_FACILITY): Payer: Medicare Other | Admitting: Internal Medicine

## 2013-04-19 ENCOUNTER — Encounter: Payer: Self-pay | Admitting: Internal Medicine

## 2013-04-19 NOTE — Progress Notes (Signed)
PROGRESS NOTE  DATE: 04/16/2013  FACILITY:  [facility]  LEVEL OF CARE: SNF (31)  Acute Visit  CHIEF COMPLAINT:  Manage CKD stage III  HISTORY OF PRESENT ILLNESS: I was requested by the staff to assess the patient regarding above problem(s):  CHRONIC KIDNEY DISEASE: The patient's chronic kidney disease is unstable.  Staff deny increasing lower extremity swelling or confusion. Last BUN and creatinine are: 45 and 1.67 on 04-10-13. On 04-08-13 BUN 32, creatinine 1.48. Patient is currently not on renal toxic medications. Patient is a poor historian due to dementia.  PAST MEDICAL HISTORY : Reviewed.  No changes.  CURRENT MEDICATIONS: Reviewed per [facility]  REVIEW OF SYSTEMS: Unobtainable due to dementia  PHYSICAL EXAMINATION  GENERAL: no acute distress, normal body habitus NECK: supple, trachea midline, no neck masses, no thyroid tenderness, no thyromegaly RESPIRATORY: breathing is even & unlabored, BS CTAB CARDIAC: RRR, no murmur,no extra heart sounds, +2 bilateral lower extremity edema GI: abdomen soft, normal BS, no masses, no tenderness, no hepatomegaly, no splenomegaly PSYCHIATRIC: the patient is alert & oriented to person, affect & behavior appropriate  LABS/RADIOLOGY: See history of present illness  ASSESSMENT/PLAN:  Chronic kidney disease stage III-unstable problem. Not on renal toxic medications. We will recheck in one week.  CPT CODE: 16109

## 2013-04-27 ENCOUNTER — Other Ambulatory Visit: Payer: Self-pay | Admitting: *Deleted

## 2013-04-27 MED ORDER — MORPHINE SULFATE 15 MG PO TABS: ORAL_TABLET | ORAL | Status: DC

## 2013-05-01 ENCOUNTER — Other Ambulatory Visit: Payer: Self-pay | Admitting: *Deleted

## 2013-05-01 MED ORDER — MORPHINE SULFATE 15 MG PO TABS: ORAL_TABLET | ORAL | Status: DC

## 2013-05-07 ENCOUNTER — Non-Acute Institutional Stay (SKILLED_NURSING_FACILITY): Payer: Medicare Other | Admitting: Internal Medicine

## 2013-05-07 DIAGNOSIS — N183 Chronic kidney disease, stage 3 unspecified: Secondary | ICD-10-CM

## 2013-05-07 DIAGNOSIS — N039 Chronic nephritic syndrome with unspecified morphologic changes: Principal | ICD-10-CM

## 2013-05-07 DIAGNOSIS — D631 Anemia in chronic kidney disease: Secondary | ICD-10-CM

## 2013-05-08 NOTE — Progress Notes (Signed)
         PROGRESS NOTE  DATE: 05/07/2013  FACILITY:  [facility]  LEVEL OF CARE: SNF (31)  Acute Visit  CHIEF COMPLAINT:  Manage anemia off chronic kidney disease and chronic kidney disease stage III  HISTORY OF PRESENT ILLNESS: I was requested by the staff to assess the patient regarding above problem(s):  ANEMIA: The anemia has been stable. The staff deny fatigue, melena or hematochezia. No complications from the medications currently being used. On 05/06/13 hemoglobin 10.8, MCV 88. In 12-14 hemoglobin 10.4. Patient is a poor historian due to dementia.  CHRONIC KIDNEY DISEASE: The patient's chronic kidney disease remains stable.  Staff deny increasing lower extremity swelling or confusion. Last BUN and creatinine are: 34 and 1.39 on 05/06/13. In 12/14 BUN 45, creatinine 1.6.  PAST MEDICAL HISTORY : Reviewed.  No changes.  [facility]  REVIEW OF SYSTEMS: Unobtainable due to dementia  PHYSICAL EXAMINATION  GENERAL: no acute distress, moderately obese body habitus NECK: supple, trachea midline, no neck masses, no thyroid tenderness, no thyromegaly RESPIRATORY: breathing is even & unlabored, BS CTAB CARDIAC: RRR, no murmur,no extra heart sounds, bilateral lower extremity edematous GI: abdomen soft, normal BS, no masses, no tenderness, no hepatomegaly, no splenomegaly PSYCHIATRIC: the patient is alert & disoriented, affect & behavior appropriate  LABS/RADIOLOGY: See history of present illness  ASSESSMENT/PLAN:  Anemia of chronic kidney disease-hemoglobin stable Chronic kidney disease stage III- renal functions improved.  CPT CODE: [id number]

## 2013-05-11 ENCOUNTER — Non-Acute Institutional Stay (SKILLED_NURSING_FACILITY): Payer: Medicare Other | Admitting: Family

## 2013-05-11 ENCOUNTER — Encounter: Payer: Self-pay | Admitting: Family

## 2013-05-11 DIAGNOSIS — N039 Chronic nephritic syndrome with unspecified morphologic changes: Principal | ICD-10-CM

## 2013-05-11 DIAGNOSIS — E8809 Other disorders of plasma-protein metabolism, not elsewhere classified: Secondary | ICD-10-CM

## 2013-05-11 DIAGNOSIS — D631 Anemia in chronic kidney disease: Secondary | ICD-10-CM

## 2013-05-11 DIAGNOSIS — N189 Chronic kidney disease, unspecified: Secondary | ICD-10-CM

## 2013-05-11 NOTE — Progress Notes (Signed)
Patient ID: [name], female   DOB: 10/12/1915, 80 y.o.   MRN: [id number]  Date: 05/11/13 [facility]  Code Status:  Full  Chief Complaint  Patient presents with  . Acute Visit    Abnormal labs    HPI: Pt f/u for abnormal labs. Health care team denies issues or concerns at present.      No Known Allergies   Medication List       This list is accurate as of: 05/11/13  3:48 PM.  Always use your most recent med list.               acetaminophen 500 MG tablet  Commonly known as:  TYLENOL  Take 1,000 mg by mouth every 6 (six) hours as needed. For pain     AMBULATORY NON FORMULARY MEDICATION  - Lorazepam 1mg /ml Gel  - Sig: Apply 1ml topically every morning for anxiety; Apply 1ml twice daily as needed for anxiety     amLODipine 10 MG tablet  Commonly known as:  NORVASC  Take 1 tablet by mouth daily.     aspirin 81 MG tablet  Take 81 mg by mouth daily.     BOOST PUDDING PO  Take 1 packet by mouth 3 (three) times daily with meals.     calcium carbonate 1250 MG tablet  Commonly known as:  OS-CAL - dosed in mg of elemental calcium  Take 1 tablet by mouth daily.     CERTAGEN PO  Take 1 tablet by mouth daily.     divalproex 125 MG capsule  Commonly known as:  DEPAKOTE SPRINKLE  Take 3 capsules by mouth 2 (two) times daily. Takes 3 capsules in the morning and at bedtime then takes 2 capsules at 2 pm daily     EXELON 9.5 mg/24hr  Generic drug:  rivastigmine  Apply 1 patch topically daily.     furosemide 20 MG tablet  Commonly known as:  LASIX  Take 1 tablet by mouth daily.     hydrALAZINE 10 MG tablet  Commonly known as:  APRESOLINE  Take 10 mg by mouth 3 (three) times daily.     lactulose 10 GM/15ML solution  Commonly known as:  CHRONULAC  Take 20 g by mouth 2 (two) times daily.     LORazepam 2 MG tablet  Commonly known as:  ATIVAN  Take 0.5 mg by mouth daily. For anxiety     metoprolol 100 MG tablet  Commonly known as:  LOPRESSOR    Take 1 tablet by mouth Twice daily.     morphine 15 MG tablet  Commonly known as:  MSIR  Take one tablet by mouth every 6 hours as needed for pain.     NAMENDA 5 MG tablet  Generic drug:  memantine  Take 1 tablet by mouth Twice daily.     polyethylene glycol packet  Commonly known as:  MIRALAX / GLYCOLAX  Take 17 g by mouth every other day. Monday, Wednesday, and Friday     sennosides-docusate sodium 8.6-50 MG tablet  Commonly known as:  SENOKOT-S  Take 2 tablets by mouth at bedtime.     sertraline 100 MG tablet  Commonly known as:  ZOLOFT  Take 2 tablets by mouth daily.          DATA REVIEWED  Laboratory Studies: 05/06/13-WBC 9.5, Hemoglobin 10.4, Hematocrit 32.7, Platelets 267, BUN 34, Creatinine 1.39, Na 143, K 5.2, Cl 102, Ca 8.6, Albumin 3.1  Past Medical History  Diagnosis Date  . HTN (hypertension)   . Sick sinus syndrome     s/p PPM, PPM generator change 01/2004 and 12/13  . Dementia     h/o aggressive behavior  . Chronic renal insufficiency   . PVD (peripheral vascular disease)   . Normocytic anemia     Past Surgical History  Procedure Laterality Date  . Pacemaker insertion  07/31/1991  . Pacemaker generator change  04/15/12    MDT Sensia generator change by Dr Johney Frame    Review of Systems  Unable to perform ROS: dementia     Physical Exam Filed Vitals:   05/11/13 1544  BP: 127/74  Pulse: 63  Temp: 97.4 F (36.3 C)  Resp: 20   There is no weight on file to calculate BMI. Physical Exam  Constitutional:  Dressed and groomed appropriately sitting upright in wheelchair  Cardiovascular: Normal rate and regular rhythm.   Pulmonary/Chest: Effort normal and breath sounds normal.  Neurological: She is alert. GCS eye subscore is 4. GCS verbal subscore is 4. GCS motor subscore is 6.  Psychiatric:  Intermitted uncooperativeness      ASSESSMENT/PLAN  Nutrition consult for protein intake   Follow up:prn  Time Spent: 50 minutes coordinating  care, face to face encounter, reviewing chart

## 2013-05-14 ENCOUNTER — Non-Acute Institutional Stay (SKILLED_NURSING_FACILITY): Payer: Medicare Other | Admitting: Internal Medicine

## 2013-05-14 DIAGNOSIS — R609 Edema, unspecified: Secondary | ICD-10-CM

## 2013-05-14 DIAGNOSIS — I150 Renovascular hypertension: Secondary | ICD-10-CM

## 2013-05-14 DIAGNOSIS — N183 Chronic kidney disease, stage 3 unspecified: Secondary | ICD-10-CM

## 2013-05-14 DIAGNOSIS — F0390 Unspecified dementia without behavioral disturbance: Secondary | ICD-10-CM

## 2013-05-16 NOTE — Progress Notes (Signed)
Patient ID: [name], female   DOB: 07/24/1915, 80 y.o.   MRN: [id number]         PROGRESS NOTE  DATE:  05/14/2013  [facility]   LEVEL OF CARE: SNF  Routine Visit  CHIEF COMPLAINT:  Manage CKD, dementia and hypertension.   HISTORY OF PRESENT ILLNESS:  REASSESSMENT OF ONGOING PROBLEM(S):  DEMENTIA: The dementia is very advanced and the patient is a poor historian.  The dementia remains stable and continues to function adequately in the current living environment with supervision.  The patient has had little changes in behavior. No complications noted from the medications presently being used.   Advanced.  Pt is a poor historian.  HTN: Pt 's HTN remains stable.  Staff deny CP, sob, DOE, headaches, dizziness or visual disturbances.  No complications from the medications currently being used.  Last BP : 140/90,136/68,140/84, 140/80, 130/60, 126/68,108/88, 142/82, 140/86, 136/86, 140/88  CHRONIC KIDNEY DISEASE: The patient's chronic kidney disease remains stable.  staff deny increasing lower extremity swelling or confusion. Last BUN and creatinine are: 22/1.4 on 10/27/12 & 43/1.61 on 10/15/12, in 7-14 bun 22, cr 1.14, in 1-15 BUN 34, creatinine 1.39.  PAST MEDICAL HISTORY : Reviewed.  No changes.  [facility]  REVIEW OF SYSTEMS:  Unobtainable due to dementia.    PHYSICAL EXAMINATION  VS:  T 97.6      P 80    RR 20   BP 140/88    POX %     WT (Lb) 175  GENERAL: no acute distress, moderately obese body habitus NECK: supple, trachea midline, no neck masses, no thyroid tenderness, no thyromegaly RESPIRATORY: breathing is even & unlabored, BS CTAB CARDIAC: RRR, no murmur,no extra heart sounds EDEMA/VARICOSITIES:  +2 bilateral lower extremity edema  ARTERIAL:  pedal pulses nonpalpable  GI: abdomen soft, normal BS, no masses, no tenderness, no hepatomegaly, no splenomegaly PSYCHIATRIC: the patient is alert & disoriented, affect & behavior  appropriate  LABS/RADIOLOGY:  1-15 hemoglobin 10.8, MCV 88 otherwise CBC normal, creatinine 1.39 otherwise CMP normal, Depakote level 29 12-14 hemoglobin A1c 6.5  11- 14 Depakote level 32  10-14 depakote level 29  10/27/12 bun 22, cr 1.14 10/15/12 bun 43, cr 1.61 ow bmp nl 10/09/12 Hb 9.9, mcv 92 ow cbc nl, HDL 39 ow FLP nl, tp 5.6, alb 3.4 ow liver profile nl, HbA1c 6.1 4/14 glucose 129, creatinine 1.47 otherwise BMP normal, Depakote 40 06/2012:  Hemoglobin 10.2, MCV 93, otherwise CBC normal.   Hemoglobin A1C 6.    03/2012:  Creatinine 1.12, otherwise CMP normal.    ASSESSMENT/PLAN:  Hypertension. Blood pressure borderline.  Dementia.  Advanced.  CKD-renal fcns improved.   Lower extremity edema.  Stable.   Constipation.  No problems reported by the staff.    Coronary artery disease.  Stable.   Depression.  Zoloft was decreased.    Chronic pain.  Currently on morphine sulfate.  The patient appears comfortable  CPT CODE: [id number]

## 2013-06-02 ENCOUNTER — Other Ambulatory Visit: Payer: Self-pay | Admitting: *Deleted

## 2013-06-02 MED ORDER — MORPHINE SULFATE 15 MG PO TABS: ORAL_TABLET | ORAL | Status: DC

## 2013-06-04 ENCOUNTER — Non-Acute Institutional Stay (SKILLED_NURSING_FACILITY): Payer: Medicare Other | Admitting: Internal Medicine

## 2013-06-04 DIAGNOSIS — F02818 Dementia in other diseases classified elsewhere, unspecified severity, with other behavioral disturbance: Secondary | ICD-10-CM | POA: Insufficient documentation

## 2013-06-04 DIAGNOSIS — F0281 Dementia in other diseases classified elsewhere with behavioral disturbance: Secondary | ICD-10-CM

## 2013-06-04 NOTE — Progress Notes (Signed)
         PROGRESS NOTE  DATE: 06/04/2013  [facility]  LEVEL OF CARE: SNF (31)  Acute Visit  CHIEF COMPLAINT:  Manage dementia with behavioral disturbances  HISTORY OF PRESENT ILLNESS: I was requested by the staff to assess the patient regarding above problem(s):  DEMENTIA: The dementia is unstable. No complications noted from the medications presently being used. Staff reports that patient has been very combative ADL and wound care. Patient spits, kicks, pulls hair during care. She is on Ativan and Depakote. Patient does not follow commands due to her advanced dementia.  PAST MEDICAL HISTORY : Reviewed.  No changes.  [facility]  REVIEW OF SYSTEMS: Unobtainable due to dementia  PHYSICAL EXAMINATION  GENERAL: no acute distress, moderately obese body habitus RESPIRATORY: breathing is even & unlabored, BS CTAB CARDIAC: RRR, no murmur,no extra heart sounds, +1 bilateral edema GI: abdomen soft, normal BS, no masses, no tenderness, no hepatomegaly, no splenomegaly PSYCHIATRIC: the patient is alert & disoriented, affect & behavior appropriate  ASSESSMENT/PLAN:  Dementia with behavioral disturbances-unstable problem. Start risperidone 0.5 mg every morning.  CPT CODE: [id number]

## 2013-06-09 ENCOUNTER — Encounter: Payer: Self-pay | Admitting: *Deleted

## 2013-06-15 NOTE — Progress Notes (Signed)
Patient ID: [name], female   DOB: 02/19/1916, 80 y.o.   MRN: [id number]          PROGRESS NOTE  DATE: 04/09/2013    [facility]  LEVEL OF CARE: SNF (31)  Acute Visit  CHIEF COMPLAINT:  Manage hyperkalemia, chronic kidney disease, and anemia of chronic kidney disease.    HISTORY OF PRESENT ILLNESS: I was requested by the staff to assess the patient regarding above problem(s):  HYPERKALEMIA:  New problem.  On 04/08/2013:  Potassium was 5.5.  In 09/2012:  Potassium was 4.8.  Patient is not on potassium supplementation.  She is a poor historian due to dementia.    CHRONIC KIDNEY DISEASE: The patient's chronic kidney disease is unstable.  Staff denies increasing lower extremity swelling or confusion. Last BUN and creatinine are:   On 04/08/2013:  BUN 32, creatinine 1.48.  In 09/2012:  Creatinine 1.14.  Patient has a history of chronic kidney disease stage III.     ANEMIA: The anemia has been stable. The staff denies fatigue, melena or hematochezia. No complications from the medications currently being used.  On 04/08/2013:  Hemoglobin 10.4, MCV 89.  In 09/2012:  Hemoglobin 9.9.    PAST MEDICAL HISTORY : Reviewed.  No changes.  [facility]  REVIEW OF SYSTEMS:  Unobtainable due to dementia.      PHYSICAL EXAMINATION  GENERAL: no acute distress, morbidly obese body habitus EYES: conjunctivae normal, sclerae normal, normal eye lids NECK: supple, trachea midline, no neck masses, no thyroid tenderness, no thyromegaly LYMPHATICS: no LAN in the neck, no supraclavicular LAN RESPIRATORY: breathing is even & unlabored, BS CTAB CARDIAC: RRR, no murmur,no extra heart sounds EDEMA/VARICOSITIES:  +1 bilateral lower extremity edema     GI: abdomen soft, normal BS, no masses, no tenderness, no hepatomegaly, no splenomegaly PSYCHIATRIC: the patient is alert & oriented to person, affect & behavior  appropriate  ASSESSMENT/PLAN:  Hyperkalemia.  New onset.  Significant problem.  Give kayexalate 30 g one-time dose.  Reassess in a.m.    Chronic kidney disease stage III.  Unstable problem.   Renal functions worse.  Currently not on renal toxic medications.  Reassess.    Anemia of chronic kidney disease.  Hemoglobin improved.     THN Metrics:   Aspirin 81 mg.  BP:  132/70.  Nonsmoker.    CPT CODE: [id number]     Angela CoxGayani Y Edda Orea, MD [facility] [phone]

## 2013-06-17 ENCOUNTER — Encounter: Payer: Self-pay | Admitting: Internal Medicine

## 2013-06-17 DIAGNOSIS — E875 Hyperkalemia: Secondary | ICD-10-CM | POA: Insufficient documentation

## 2013-08-03 ENCOUNTER — Other Ambulatory Visit: Payer: Self-pay

## 2013-08-03 MED ORDER — MORPHINE SULFATE 15 MG PO TABS: ORAL_TABLET | ORAL | Status: DC

## 2013-08-03 NOTE — Telephone Encounter (Signed)
Rx faxed to [facility] @ [phone], phone number [phone]  

## 2013-08-21 ENCOUNTER — Other Ambulatory Visit: Payer: Self-pay | Admitting: *Deleted

## 2013-08-21 MED ORDER — AMBULATORY NON FORMULARY MEDICATION: Status: DC

## 2013-08-21 NOTE — Telephone Encounter (Signed)
[facility] 

## 2013-09-21 ENCOUNTER — Non-Acute Institutional Stay (SKILLED_NURSING_FACILITY): Payer: Medicare Other | Admitting: Internal Medicine

## 2013-09-21 DIAGNOSIS — I150 Renovascular hypertension: Secondary | ICD-10-CM

## 2013-09-21 DIAGNOSIS — R609 Edema, unspecified: Secondary | ICD-10-CM

## 2013-09-21 DIAGNOSIS — F02818 Dementia in other diseases classified elsewhere, unspecified severity, with other behavioral disturbance: Secondary | ICD-10-CM

## 2013-09-21 DIAGNOSIS — F0281 Dementia in other diseases classified elsewhere with behavioral disturbance: Secondary | ICD-10-CM

## 2013-09-21 DIAGNOSIS — N183 Chronic kidney disease, stage 3 unspecified: Secondary | ICD-10-CM

## 2013-09-21 NOTE — Progress Notes (Signed)
Patient ID: [name], female   DOB: 09/30/1915, 80 y.o.   MRN: [id number]         PROGRESS NOTE  DATE:  09/21/2013  FACILITY: [facility]   LEVEL OF CARE: SNF  Routine Visit  CHIEF COMPLAINT:  Manage CKD, dementia and hypertension.   HISTORY OF PRESENT ILLNESS:  REASSESSMENT OF ONGOING PROBLEM(S):  DEMENTIA: The dementia is very advanced and the patient is a poor historian.  The dementia remains stable and continues to function adequately in the current living environment with supervision.  The patient has had little changes in behavior. No complications noted from the medications presently being used.   Advanced.  Pt is a poor historian.  HTN: Pt 's HTN remains stable.  Staff deny CP, sob, DOE, headaches, dizziness or visual disturbances.  No complications from the medications currently being used.  Last BP : 140/90,136/68,140/84, 140/80, 130/60, 126/68,108/88, 142/82, 140/86, 136/86, 140/88, 142/88  CHRONIC KIDNEY DISEASE: The patient's chronic kidney disease remains stable.  staff deny increasing lower extremity swelling or confusion. Last BUN and creatinine are: 22/1.4 on 10/27/12 & 43/1.61 on 10/15/12, in 7-14 bun 22, cr 1.14, in 1-15 BUN 34, creatinine 1.39.  PAST MEDICAL HISTORY : Reviewed.  No changes.  CURRENT MEDICATIONS: Reviewed per [facility]  REVIEW OF SYSTEMS:  Unobtainable due to dementia.    PHYSICAL EXAMINATION  VS:  See VS section  GENERAL: no acute distress, moderately obese body habitus EYES: normal sclerae, normal conjunctivae, no discharge NECK: supple, trachea midline, no neck masses, no thyroid tenderness, no thyromegaly LYMPHATICS: no cervical LAN, no supraclavicular LAN RESPIRATORY: breathing is even & unlabored, BS CTAB CARDIAC: RRR, no murmur,no extra heart sounds EDEMA/VARICOSITIES:  +2 bilateral lower extremity edema  ARTERIAL:  pedal pulses nonpalpable  GI: abdomen soft, normal BS, no masses, no tenderness, no hepatomegaly, no  splenomegaly PSYCHIATRIC: the patient is alert & disoriented, affect & behavior appropriate  LABS/RADIOLOGY:  3-15 HbA1c 6.2 1-15 hemoglobin 10.8, MCV 88 otherwise CBC normal, creatinine 1.39 otherwise CMP normal, Depakote level 29 12-14 hemoglobin A1c 6.5  11- 14 Depakote level 32  10-14 depakote level 29  10/27/12 bun 22, cr 1.14 10/15/12 bun 43, cr 1.61 ow bmp nl 10/09/12 Hb 9.9, mcv 92 ow cbc nl, HDL 39 ow FLP nl, tp 5.6, alb 3.4 ow liver profile nl, HbA1c 6.1 4/14 glucose 129, creatinine 1.47 otherwise BMP normal, Depakote 40 06/2012:  Hemoglobin 10.2, MCV 93, otherwise CBC normal.   Hemoglobin A1C 6.    03/2012:  Creatinine 1.12, otherwise CMP normal.    ASSESSMENT/PLAN:  Hypertension. Blood pressure uncontrolled.  Increase hydralazine to 25mg  tid.  Dementia.  Advanced.  CKD-renal fcns improved.   Lower extremity edema.  Stable.   Constipation.  No problems reported by the staff.    Coronary artery disease.  Stable.   Depression.  On Zoloft  Chronic pain.  Currently on morphine sulfate.  The patient appears comfortable  CPT CODE: 33612  Newton Pigg. Kerry Dory, MD [facility] [phone]

## 2013-09-28 ENCOUNTER — Encounter: Payer: Self-pay | Admitting: Cardiology

## 2013-10-09 ENCOUNTER — Telehealth: Payer: Self-pay | Admitting: Internal Medicine

## 2013-10-09 NOTE — Telephone Encounter (Signed)
CERTIFIED LETTER SIGNED BY RHONDA KING FITZ 10-05-13/MT

## 2013-10-14 ENCOUNTER — Non-Acute Institutional Stay (SKILLED_NURSING_FACILITY): Payer: Medicare Other | Admitting: Internal Medicine

## 2013-10-14 DIAGNOSIS — R609 Edema, unspecified: Secondary | ICD-10-CM

## 2013-10-14 DIAGNOSIS — I150 Renovascular hypertension: Secondary | ICD-10-CM

## 2013-10-14 DIAGNOSIS — N183 Chronic kidney disease, stage 3 unspecified: Secondary | ICD-10-CM

## 2013-10-14 DIAGNOSIS — F0281 Dementia in other diseases classified elsewhere with behavioral disturbance: Secondary | ICD-10-CM

## 2013-10-14 DIAGNOSIS — F02818 Dementia in other diseases classified elsewhere, unspecified severity, with other behavioral disturbance: Secondary | ICD-10-CM

## 2013-10-15 NOTE — Progress Notes (Signed)
Patient ID: [name], female   DOB: 07/01/1915, 80 y.o.   MRN: [id number]         PROGRESS NOTE  DATE:  10/14/2013  [facility]   LEVEL OF CARE: SNF  Routine Visit  CHIEF COMPLAINT:  Manage CKD, dementia and hypertension.   HISTORY OF PRESENT ILLNESS:  REASSESSMENT OF ONGOING PROBLEM(S):  DEMENTIA: The dementia is very advanced and the patient is a poor historian.  The dementia remains stable and continues to function adequately in the current living environment with supervision.  The patient has had little changes in behavior. No complications noted from the medications presently being used.   Advanced.  Pt is a poor historian.  HTN: Pt 's HTN remains stable.  Staff deny CP, sob, DOE, headaches, dizziness or visual disturbances.  No complications from the medications currently being used.  Last BP : 140/90,136/68,140/84, 140/80, 130/60, 126/68,108/88, 142/82, 140/86, 136/86, 140/88, 142/88, 138/64  CHRONIC KIDNEY DISEASE: The patient's chronic kidney disease remains stable.  staff deny increasing lower extremity swelling or confusion. Last BUN and creatinine are: 22/1.4 on 10/27/12 & 43/1.61 on 10/15/12, in 7-14 bun 22, cr 1.14, in 1-15 BUN 34, creatinine 1.39.  PAST MEDICAL HISTORY : Reviewed.  No changes.  [facility]  REVIEW OF SYSTEMS:  Unobtainable due to dementia.    PHYSICAL EXAMINATION  VS:  See VS section  GENERAL: no acute distress, moderately obese body habitus NECK: supple, trachea midline, no neck masses, no thyroid tenderness, no thyromegaly RESPIRATORY: breathing is even & unlabored, BS CTAB CARDIAC: RRR, no murmur,no extra heart sounds EDEMA/VARICOSITIES:  +1 bilateral lower extremity edema  ARTERIAL:  pedal pulses nonpalpable  GI: abdomen soft, normal BS, no masses, no tenderness, no hepatomegaly, no splenomegaly PSYCHIATRIC: the patient is alert & disoriented, affect & behavior appropriate  LABS/RADIOLOGY:  3-15 HbA1c  6.2 1-15 hemoglobin 10.8, MCV 88 otherwise CBC normal, creatinine 1.39 otherwise CMP normal, Depakote level 29 12-14 hemoglobin A1c 6.5  11- 14 Depakote level 32  10-14 depakote level 29  10/27/12 bun 22, cr 1.14 10/15/12 bun 43, cr 1.61 ow bmp nl 10/09/12 Hb 9.9, mcv 92 ow cbc nl, HDL 39 ow FLP nl, tp 5.6, alb 3.4 ow liver profile nl, HbA1c 6.1 4/14 glucose 129, creatinine 1.47 otherwise BMP normal, Depakote 40 06/2012:  Hemoglobin 10.2, MCV 93, otherwise CBC normal.   Hemoglobin A1C 6.    03/2012:  Creatinine 1.12, otherwise CMP normal.    ASSESSMENT/PLAN:  Hypertension. Controlled.  Dementia.  Advanced.  CKD-renal fcns improved.   Lower extremity edema.  Stable.   Constipation.  No problems reported by the staff.    Coronary artery disease.  Stable.   Depression.  On Zoloft  Chronic pain.  Currently on morphine sulfate.  The patient appears comfortable  CPT CODE: 7253699308  Newton PiggGayani Y. Kerry Doryasanayaka, MD [facility] [phone]

## 2013-11-16 ENCOUNTER — Non-Acute Institutional Stay (SKILLED_NURSING_FACILITY): Payer: Medicare Other | Admitting: Internal Medicine

## 2013-11-16 DIAGNOSIS — N183 Chronic kidney disease, stage 3 unspecified: Secondary | ICD-10-CM

## 2013-11-16 DIAGNOSIS — R609 Edema, unspecified: Secondary | ICD-10-CM

## 2013-11-16 DIAGNOSIS — F02818 Dementia in other diseases classified elsewhere, unspecified severity, with other behavioral disturbance: Secondary | ICD-10-CM

## 2013-11-16 DIAGNOSIS — F0281 Dementia in other diseases classified elsewhere with behavioral disturbance: Secondary | ICD-10-CM

## 2013-11-16 DIAGNOSIS — I150 Renovascular hypertension: Secondary | ICD-10-CM

## 2013-11-16 NOTE — Progress Notes (Signed)
Patient ID: [name], female   DOB: 06/09/1915, 80 y.o.   MRN: [id number]         PROGRESS NOTE  DATE:  11/16/2013  [facility]   LEVEL OF CARE: SNF  Routine Visit  CHIEF COMPLAINT:  Manage CKD, dementia and hypertension.   HISTORY OF PRESENT ILLNESS:  REASSESSMENT OF ONGOING PROBLEM(S):  DEMENTIA: The dementia is very advanced and the patient is a poor historian.  The dementia remains stable and continues to function adequately in the current living environment with supervision.  The patient has had little changes in behavior. No complications noted from the medications presently being used.   Advanced.  Pt is a poor historian.  HTN: Pt 's HTN remains stable.  Staff deny CP, sob, DOE, headaches, dizziness or visual disturbances.  No complications from the medications currently being used.  Last BP : 140/90,136/68,140/84, 140/80, 130/60, 126/68,108/88, 142/82, 140/86, 136/86, 140/88, 142/88, 138/64, 140/65  CHRONIC KIDNEY DISEASE: The patient's chronic kidney disease remains stable.  staff deny increasing lower extremity swelling or confusion. Last BUN and creatinine are: 22/1.4 on 10/27/12 & 43/1.61 on 10/15/12, in 7-14 bun 22, cr 1.14, in 1-15 BUN 34, creatinine 1.39.  PAST MEDICAL HISTORY : Reviewed.  No changes.  [facility]: [facility]  REVIEW OF SYSTEMS:  Unobtainable due to dementia.    PHYSICAL EXAMINATION  VS:  See VS section  GENERAL: no acute distress, moderately obese body habitus NECK: supple, trachea midline, no neck masses, no thyroid tenderness, no thyromegaly RESPIRATORY: breathing is even & unlabored, BS CTAB CARDIAC: RRR, no murmur,no extra heart sounds EDEMA/VARICOSITIES:  +1 bilateral lower extremity edema  ARTERIAL:  pedal pulses nonpalpable  GI: abdomen soft, normal BS, no masses, no tenderness, no hepatomegaly, no splenomegaly PSYCHIATRIC: the patient is alert & disoriented, affect & behavior  appropriate  LABS/RADIOLOGY:  3-15 HbA1c 6.2 1-15 hemoglobin 10.8, MCV 88 otherwise CBC normal, creatinine 1.39 otherwise CMP normal, Depakote level 29 12-14 hemoglobin A1c 6.5  11- 14 Depakote level 32  10-14 depakote level 29  10/27/12 bun 22, cr 1.14 10/15/12 bun 43, cr 1.61 ow bmp nl 10/09/12 Hb 9.9, mcv 92 ow cbc nl, HDL 39 ow FLP nl, tp 5.6, alb 3.4 ow liver profile nl, HbA1c 6.1 4/14 glucose 129, creatinine 1.47 otherwise BMP normal, Depakote 40 06/2012:  Hemoglobin 10.2, MCV 93, otherwise CBC normal.   Hemoglobin A1C 6.    03/2012:  Creatinine 1.12, otherwise CMP normal.    ASSESSMENT/PLAN:  Hypertension. Blood pressure borderline  Dementia.  Advanced. Depakote was increased  CKD-renal fcns improved. Recheck   Lower extremity edema.  Stable.   Constipation.  No problems reported by the staff.    Coronary artery disease.  Stable.   Depression.  On Zoloft  Chronic pain.  Currently on morphine sulfate.  The patient appears comfortable  Check CBC, CMP and Depakote level  CPT CODE: [id number]  Newton PiggGayani Y. Kerry Doryasanayaka, MD [facility] [phone]

## 2013-11-19 ENCOUNTER — Non-Acute Institutional Stay (SKILLED_NURSING_FACILITY): Payer: Medicare Other | Admitting: Internal Medicine

## 2013-11-19 DIAGNOSIS — N039 Chronic nephritic syndrome with unspecified morphologic changes: Principal | ICD-10-CM

## 2013-11-19 DIAGNOSIS — D631 Anemia in chronic kidney disease: Secondary | ICD-10-CM

## 2013-11-19 DIAGNOSIS — N183 Chronic kidney disease, stage 3 unspecified: Secondary | ICD-10-CM

## 2013-11-24 NOTE — Progress Notes (Signed)
Patient ID: Blima RichAda T Omara, female   DOB: 10/06/1915, 80 y.o.   MRN: [id number]           PROGRESS NOTE  DATE: 11/19/2013         [facility]  LEVEL OF CARE: SNF (31)  Acute Visit  CHIEF COMPLAINT:  Manage anemia of chronic kidney disease and chronic kidney disease stage III.    HISTORY OF PRESENT ILLNESS: I was requested by the staff to assess the patient regarding above problem(s):  ANEMIA: The anemia is unstable. The staff denies fatigue, melena or hematochezia. No complications from the medications currently being used.   On 11/18/2013:  Hemoglobin 9.6, MCV 91.  In 04/2013:  Hemoglobin 10.8.  Patient is a poor historian.    CHRONIC KIDNEY DISEASE: The patient's chronic kidney disease remains stable.  Staff denies increasing lower extremity swelling or confusion. Last BUN and creatinine are:   On 11/18/2013:  BUN 27, creatinine 1.38.  In 04/2013:  Creatinine 1.39.    PAST MEDICAL HISTORY : Reviewed.  No changes/see problem list  CURRENT MEDICATIONS: Reviewed per [facility]/see medication list  REVIEW OF SYSTEMS:  Unobtainable due to dementia.     PHYSICAL EXAMINATION  VS: see VS section  GENERAL: no acute distress, moderately obese body habitus NECK: supple, trachea midline, no neck masses, no thyroid tenderness, no thyromegaly RESPIRATORY: breathing is even & unlabored, BS CTAB CARDIAC: RRR, no murmur,no extra heart sounds, +3 bilateral lower extremity edema     GI: abdomen soft, normal BS, no masses, no tenderness, no hepatomegaly, no splenomegaly PSYCHIATRIC: the patient is alert, disoriented, affect & behavior appropriate  ASSESSMENT/PLAN:  Anemia of chronic kidney disease.  Hemoglobin has declined.  We will monitor.    Chronic kidney disease stage III.  Renal functions stable.    CPT CODE: [id number]         Angela CoxGayani Y Dasanayaka, MD [facility] [phone]

## 2013-11-25 ENCOUNTER — Ambulatory Visit (INDEPENDENT_AMBULATORY_CARE_PROVIDER_SITE_OTHER): Payer: Medicare Other | Admitting: *Deleted

## 2013-11-25 DIAGNOSIS — I495 Sick sinus syndrome: Secondary | ICD-10-CM

## 2013-11-25 LAB — MDC_IDC_ENUM_SESS_TYPE_INCLINIC
Battery Impedance: 225 Ohm
Battery Remaining Longevity: 90 mo
Brady Statistic AS VS Percent: 5 %
Lead Channel Impedance Value: 392 Ohm
Lead Channel Impedance Value: 396 Ohm
Lead Channel Pacing Threshold Pulse Width: 0.4 ms
Lead Channel Setting Pacing Amplitude: 2.5 V
Lead Channel Setting Pacing Pulse Width: 0.4 ms
Lead Channel Setting Sensing Sensitivity: 2.8 mV
MDC IDC MSMT BATTERY VOLTAGE: 2.79 V
MDC IDC MSMT LEADCHNL RA PACING THRESHOLD AMPLITUDE: 1 V
MDC IDC MSMT LEADCHNL RA PACING THRESHOLD PULSEWIDTH: 0.4 ms
MDC IDC MSMT LEADCHNL RA SENSING INTR AMPL: 1 mV
MDC IDC MSMT LEADCHNL RV PACING THRESHOLD AMPLITUDE: 0.5 V
MDC IDC MSMT LEADCHNL RV SENSING INTR AMPL: 8 mV
MDC IDC SESS DTM: 20150729143023
MDC IDC SET LEADCHNL RV PACING AMPLITUDE: 2 V
MDC IDC STAT BRADY AP VP PERCENT: 95 %
MDC IDC STAT BRADY AP VS PERCENT: 0 %
MDC IDC STAT BRADY AS VP PERCENT: 0 %

## 2013-11-25 NOTE — Progress Notes (Signed)
Pacemaker check in clinic. Normal device function. Thresholds, sensing, impedances consistent with previous measurements. Device programmed to maximize longevity. No mode switch or high ventricular rates noted. Device programmed at appropriate safety margins. Histogram distribution appropriate for patient activity level. Device programmed to optimize intrinsic conduction. Estimated longevity 7.5 years.  Patient education completed.  ROV 6 months with Dr. Johney FrameAllred.

## 2013-12-07 ENCOUNTER — Non-Acute Institutional Stay (SKILLED_NURSING_FACILITY): Payer: Medicare Other | Admitting: Internal Medicine

## 2013-12-07 DIAGNOSIS — I150 Renovascular hypertension: Secondary | ICD-10-CM

## 2013-12-07 DIAGNOSIS — N183 Chronic kidney disease, stage 3 unspecified: Secondary | ICD-10-CM

## 2013-12-07 DIAGNOSIS — F0281 Dementia in other diseases classified elsewhere with behavioral disturbance: Secondary | ICD-10-CM

## 2013-12-07 DIAGNOSIS — F02818 Dementia in other diseases classified elsewhere, unspecified severity, with other behavioral disturbance: Secondary | ICD-10-CM

## 2013-12-07 DIAGNOSIS — R609 Edema, unspecified: Secondary | ICD-10-CM

## 2013-12-08 NOTE — Progress Notes (Signed)
Patient ID: [name], female   DOB: 07/18/1915, 80 y.o.   MRN: [id number]         PROGRESS NOTE  DATE:  12/07/2013  [facility]   LEVEL OF CARE: SNF  Routine Visit  CHIEF COMPLAINT:  Manage CKD, dementia and hypertension.   HISTORY OF PRESENT ILLNESS:  REASSESSMENT OF ONGOING PROBLEM(S):  DEMENTIA: The dementia is very advanced and the patient is a poor historian.  The dementia remains stable and continues to function adequately in the current living environment with supervision.  The patient has had little changes in behavior. No complications noted from the medications presently being used.   Advanced.  Pt is a poor historian.  HTN: Pt 's HTN remains stable.  Staff deny CP, sob, DOE, headaches, dizziness or visual disturbances.  No complications from the medications currently being used.  Last BP : 140/90,136/68,140/84, 140/80, 130/60, 126/68,108/88, 142/82, 140/86, 136/86, 140/88, 142/88, 138/64, 140/65, 133/ 74  CHRONIC KIDNEY DISEASE: The patient's chronic kidney disease remains stable.  staff deny increasing lower extremity swelling or confusion. Last BUN and creatinine are: 22/1.4 on 10/27/12 & 43/1.61 on 10/15/12, in 7-14 bun 22, cr 1.14, in 1-15 BUN 34, creatinine 1.39, in 7-15 BUN 27, creatinine 1.38  PAST MEDICAL HISTORY : Reviewed.  No changes.  [facility]S: [facility]  REVIEW OF SYSTEMS:  Unobtainable due to dementia.    PHYSICAL EXAMINATION  VS:  See VS section  GENERAL: no acute distress, moderately obese body habitus NECK: supple, trachea midline, no neck masses, no thyroid tenderness, no thyromegaly RESPIRATORY: breathing is even & unlabored, BS CTAB CARDIAC: RRR, no murmur,no extra heart sounds EDEMA/VARICOSITIES:  +1 bilateral lower extremity edema  ARTERIAL:  pedal pulses nonpalpable  GI: abdomen soft, normal BS, no masses, no tenderness, no hepatomegaly, no splenomegaly PSYCHIATRIC: the patient is alert & disoriented, affect &  behavior appropriate  LABS/RADIOLOGY: 7-15 hemoglobin A1c 6.1, hemoglobin 9.6, MCV 91 otherwise CBC normal, creatinine 1.38, albumin 3.1 otherwise CMP normal, Depakote level 38 3-15 HbA1c 6.2 1-15 hemoglobin 10.8, MCV 88 otherwise CBC normal, creatinine 1.39 otherwise CMP normal, Depakote level 29 12-14 hemoglobin A1c 6.5  11- 14 Depakote level 32  10-14 depakote level 29  10/27/12 bun 22, cr 1.14 10/15/12 bun 43, cr 1.61 ow bmp nl 10/09/12 Hb 9.9, mcv 92 ow cbc nl, HDL 39 ow FLP nl, tp 5.6, alb 3.4 ow liver profile nl, HbA1c 6.1 4/14 glucose 129, creatinine 1.47 otherwise BMP normal, Depakote 40 06/2012:  Hemoglobin 10.2, MCV 93, otherwise CBC normal.   Hemoglobin A1C 6.    03/2012:  Creatinine 1.12, otherwise CMP normal.    ASSESSMENT/PLAN:  Hypertension. Well controlled  Dementia.  Advanced. Depakote was decreased  CKD-renal fcns stable  Lower extremity edema.  Stable.   Constipation.  No problems reported by the staff.    Coronary artery disease.  Stable.   Depression.  On Zoloft  Chronic pain.  Currently on morphine sulfate.  The patient appears comfortable  CPT CODE: [id number]  Newton PiggGayani Y. Kerry Doryasanayaka, MD [facility] [phone]

## 2013-12-11 ENCOUNTER — Encounter: Payer: Self-pay | Admitting: Internal Medicine

## 2013-12-25 ENCOUNTER — Other Ambulatory Visit: Payer: Self-pay | Admitting: *Deleted

## 2013-12-25 MED ORDER — MORPHINE SULFATE 15 MG PO TABS: ORAL_TABLET | ORAL | Status: DC

## 2013-12-25 NOTE — Telephone Encounter (Signed)
[facility] 

## 2014-01-15 ENCOUNTER — Non-Acute Institutional Stay (SKILLED_NURSING_FACILITY): Payer: Medicare Other | Admitting: Internal Medicine

## 2014-01-15 DIAGNOSIS — I150 Renovascular hypertension: Secondary | ICD-10-CM

## 2014-01-15 DIAGNOSIS — R609 Edema, unspecified: Secondary | ICD-10-CM

## 2014-01-15 DIAGNOSIS — N183 Chronic kidney disease, stage 3 unspecified: Secondary | ICD-10-CM

## 2014-01-15 DIAGNOSIS — F02818 Dementia in other diseases classified elsewhere, unspecified severity, with other behavioral disturbance: Secondary | ICD-10-CM

## 2014-01-15 DIAGNOSIS — F0281 Dementia in other diseases classified elsewhere with behavioral disturbance: Secondary | ICD-10-CM

## 2014-01-16 NOTE — Progress Notes (Signed)
Patient ID: [name], female   DOB: [birth year], 80 y.o.   MRN: [id number]         PROGRESS NOTE  DATE:  01/15/2014  [facility]   LEVEL OF CARE: SNF  Routine Visit  CHIEF COMPLAINT:  Manage CKD, dementia and hypertension.   HISTORY OF PRESENT ILLNESS:  REASSESSMENT OF ONGOING PROBLEM(S):  DEMENTIA: The dementia is very advanced and the patient is a poor historian.  The dementia remains stable and continues to function adequately in the current living environment with supervision.  The patient has had little changes in behavior. No complications noted from the medications presently being used.   Advanced.  Pt is a poor historian.  HTN: Pt 's HTN remains stable.  Staff deny CP, sob, DOE, headaches, dizziness or visual disturbances.  No complications from the medications currently being used.  Last BP : 140/90,136/68,140/84, 140/80, 130/60, 126/68,108/88, 142/82, 140/86, 136/86, 140/88, 142/88, 138/64, 140/65, 133/ 74, 142/92  CHRONIC KIDNEY DISEASE: The patient's chronic kidney disease remains stable.  staff deny increasing lower extremity swelling or confusion. Last BUN and creatinine are: 22/1.4 on 10/27/12 & 43/1.61 on 10/15/12, in 7-14 bun 22, cr 1.14, in 1-15 BUN 34, creatinine 1.39, in 7-15 BUN 27, creatinine 1.38  PAST MEDICAL HISTORY : Reviewed.  No changes.  CURRENT MEDICATIONS: Reviewed per [facility]  REVIEW OF SYSTEMS:  Unobtainable due to dementia.    PHYSICAL EXAMINATION  VS:  See VS section  GENERAL: no acute distress, moderately obese body habitus NECK: supple, trachea midline, no neck masses, no thyroid tenderness, no thyromegaly RESPIRATORY: breathing is even & unlabored, BS CTAB CARDIAC: RRR, no murmur,no extra heart sounds EDEMA/VARICOSITIES:  +1 bilateral lower extremity edema  ARTERIAL:  pedal pulses nonpalpable  GI: abdomen soft, normal BS, no masses, no tenderness, no hepatomegaly, no splenomegaly PSYCHIATRIC: the patient is alert & disoriented,  affect & behavior appropriate  LABS/RADIOLOGY: 7-15 hemoglobin A1c 6.1, hemoglobin 9.6, MCV 91 otherwise CBC normal, creatinine 1.38, albumin 3.1 otherwise CMP normal, Depakote level 38 3-15 HbA1c 6.2 1-15 hemoglobin 10.8, MCV 88 otherwise CBC normal, creatinine 1.39 otherwise CMP normal, Depakote level 29 12-14 hemoglobin A1c 6.5  11- 14 Depakote level 32  10-14 depakote level 29  10/27/12 bun 22, cr 1.14 10/15/12 bun 43, cr 1.61 ow bmp nl 10/09/12 Hb 9.9, mcv 92 ow cbc nl, HDL 39 ow FLP nl, tp 5.6, alb 3.4 ow liver profile nl, HbA1c 6.1 4/14 glucose 129, creatinine 1.47 otherwise BMP normal, Depakote 40 06/2012:  Hemoglobin 10.2, MCV 93, otherwise CBC normal.   Hemoglobin A1C 6.    03/2012:  Creatinine 1.12, otherwise CMP normal.    ASSESSMENT/PLAN:  Hypertension. Blood pressure borderline. On maximum medications.  Dementia.  Advanced. Risperdal was d/c'd.  CKD-renal fcns stable  Lower extremity edema.  Stable.   Constipation.  No problems reported by the staff.    Coronary artery disease.  Stable.   Depression.  On Zoloft  Chronic pain.  Currently on morphine sulfate.  The patient appears comfortable  CPT CODE: 40981  Newton Pigg. Kerry Dory, MD [facility] [phone]

## 2014-01-27 ENCOUNTER — Other Ambulatory Visit: Payer: Self-pay | Admitting: *Deleted

## 2014-01-27 MED ORDER — AMBULATORY NON FORMULARY MEDICATION: Status: DC

## 2014-01-27 NOTE — Telephone Encounter (Signed)
[facility] 

## 2014-02-03 ENCOUNTER — Other Ambulatory Visit: Payer: Self-pay | Admitting: *Deleted

## 2014-02-03 MED ORDER — AMBULATORY NON FORMULARY MEDICATION: Status: DC

## 2014-02-03 MED ORDER — MORPHINE SULFATE 15 MG PO TABS: ORAL_TABLET | ORAL | Status: AC

## 2014-02-03 NOTE — Telephone Encounter (Signed)
[facility] 

## 2014-02-08 ENCOUNTER — Non-Acute Institutional Stay (SKILLED_NURSING_FACILITY): Payer: Medicare Other | Admitting: Internal Medicine

## 2014-02-08 DIAGNOSIS — M79641 Pain in right hand: Secondary | ICD-10-CM

## 2014-02-10 ENCOUNTER — Non-Acute Institutional Stay (SKILLED_NURSING_FACILITY): Payer: Medicare Other | Admitting: Internal Medicine

## 2014-02-10 DIAGNOSIS — F0390 Unspecified dementia without behavioral disturbance: Secondary | ICD-10-CM

## 2014-02-10 DIAGNOSIS — I150 Renovascular hypertension: Secondary | ICD-10-CM

## 2014-02-10 DIAGNOSIS — R609 Edema, unspecified: Secondary | ICD-10-CM

## 2014-02-10 DIAGNOSIS — N183 Chronic kidney disease, stage 3 unspecified: Secondary | ICD-10-CM

## 2014-02-12 NOTE — Progress Notes (Signed)
Patient ID: [name], female   DOB: 01/14/1916, 80 y.o.   MRN: [id number]           PROGRESS NOTE  DATE: 02/08/2014          [facility]  LEVEL OF CARE: SNF (31)  Acute Visit  CHIEF COMPLAINT:  Manage right hand pain.    HISTORY OF PRESENT ILLNESS: I was requested by the staff to assess the patient regarding above problem(s):  Staff report that patient was having pain in her right hand, inner aspect, with some bruising and swelling.  The area was exquisitely tender to palpation.  They cannot identify how it occurred.  There are no other associated signs and symptoms.  Patient is a poor historian due to dementia.    PAST MEDICAL HISTORY : Reviewed.  No changes/see problem list  CURRENT MEDICATIONS: Reviewed per [facility]/see medication list  REVIEW OF SYSTEMS:  Unobtainable due to dementia.    PHYSICAL EXAMINATION  VS: see VS section  GENERAL: no acute distress, moderately obese body habitus NECK: supple, trachea midline, no neck masses, no thyroid tenderness, no thyromegaly RESPIRATORY: breathing is even & unlabored, BS CTAB CARDIAC: RRR, no murmur,no extra heart sounds, left lower extremity has +2 edema        GI: abdomen soft, normal BS, no masses, no tenderness, no hepatomegaly, no splenomegaly PSYCHIATRIC: the patient is alert, disoriented, affect & behavior appropriate MUSCULOSKELETAL: medial right wrist has an area of bruising and is exquisitely tender to palpation    ASSESSMENT/PLAN:  Right hand pain and bruising.  New problem.  Hand x-ray is pending.  We will hold aspirin for 10 days.    CPT CODE: [id number]          Angela CoxGayani Y Dasanayaka, MD [facility] [phone]

## 2014-02-12 NOTE — Progress Notes (Signed)
Patient ID: [name], female   DOB: 10/02/1915, 80 y.o.   MRN: [id number]         PROGRESS NOTE  DATE:  02/10/2014  [facility]   LEVEL OF CARE: SNF  Routine Visit  CHIEF COMPLAINT:  Manage CKD, dementia and hypertension.   HISTORY OF PRESENT ILLNESS:  REASSESSMENT OF ONGOING PROBLEM(S):  DEMENTIA: The dementia is very advanced and the patient is a poor historian.  The dementia remains stable and continues to function adequately in the current living environment with supervision.  The patient has had little changes in behavior. No complications noted from the medications presently being used.   Advanced.  Pt is a poor historian.  HTN: Pt 's HTN remains stable.  Staff deny CP, sob, DOE, headaches, dizziness or visual disturbances.  No complications from the medications currently being used.  Last BP : 140/90,136/68,140/84, 140/80, 130/60, 126/68,108/88, 142/82, 140/86, 136/86, 140/88, 142/88, 138/64, 140/65, 133/ 74, 142/92, 118/86  CHRONIC KIDNEY DISEASE: The patient's chronic kidney disease remains stable.  staff deny increasing lower extremity swelling or confusion. Last BUN and creatinine are: 22/1.4 on 10/27/12 & 43/1.61 on 10/15/12, in 7-14 bun 22, cr 1.14, in 1-15 BUN 34, creatinine 1.39, in 7-15 BUN 27, creatinine 1.38  PAST MEDICAL HISTORY : Reviewed.  No changes.  CURRENT MEDICATIONS: [facility]  REVIEW OF SYSTEMS:  Unobtainable due to dementia.    PHYSICAL EXAMINATION  VS:  See VS section  GENERAL: no acute distress, moderately obese body habitus NECK: supple, trachea midline, no neck masses, no thyroid tenderness, no thyromegaly RESPIRATORY: breathing is even & unlabored, BS CTAB CARDIAC: RRR, no murmur,no extra heart sounds EDEMA/VARICOSITIES:  +1 bilateral lower extremity edema  ARTERIAL:  pedal pulses nonpalpable  GI: abdomen soft, normal BS, no masses, no tenderness, no hepatomegaly, no splenomegaly PSYCHIATRIC: the patient is alert &  disoriented, affect & behavior appropriate  LABS/RADIOLOGY: 7-15 hemoglobin A1c 6.1, hemoglobin 9.6, MCV 91 otherwise CBC normal, creatinine 1.38, albumin 3.1 otherwise CMP normal, Depakote level 38 3-15 HbA1c 6.2 1-15 hemoglobin 10.8, MCV 88 otherwise CBC normal, creatinine 1.39 otherwise CMP normal, Depakote level 29 12-14 hemoglobin A1c 6.5  11- 14 Depakote level 32  10-14 depakote level 29  10/27/12 bun 22, cr 1.14 10/15/12 bun 43, cr 1.61 ow bmp nl 10/09/12 Hb 9.9, mcv 92 ow cbc nl, HDL 39 ow FLP nl, tp 5.6, alb 3.4 ow liver profile nl, HbA1c 6.1 4/14 glucose 129, creatinine 1.47 otherwise BMP normal, Depakote 40 06/2012:  Hemoglobin 10.2, MCV 93, otherwise CBC normal.   Hemoglobin A1C 6.    03/2012:  Creatinine 1.12, otherwise CMP normal.    ASSESSMENT/PLAN:  Hypertension. Blood pressure borderline. On maximum medications.  Dementia.  Advanced. Risperdal was restarted  CKD-renal fcns stable  Lower extremity edema.  Stable.   Constipation.  No problems reported by the staff.    Coronary artery disease.  Stable.   Depression.  On Zoloft  Chronic pain.  Currently on morphine sulfate.  The patient appears comfortable  CPT CODE: [id number]  Newton PiggGayani Y. Kerry Doryasanayaka, MD [facility] [phone]

## 2014-04-08 ENCOUNTER — Encounter (HOSPITAL_COMMUNITY): Payer: Self-pay | Admitting: Internal Medicine

## 2014-07-12 ENCOUNTER — Ambulatory Visit (INDEPENDENT_AMBULATORY_CARE_PROVIDER_SITE_OTHER): Payer: Medicare Other | Admitting: Internal Medicine

## 2014-07-12 ENCOUNTER — Encounter: Payer: Self-pay | Admitting: Internal Medicine

## 2014-07-12 VITALS — BP 130/78 | HR 62 | Ht 63.0 in

## 2014-07-12 DIAGNOSIS — I495 Sick sinus syndrome: Secondary | ICD-10-CM | POA: Diagnosis not present

## 2014-07-12 DIAGNOSIS — I10 Essential (primary) hypertension: Secondary | ICD-10-CM

## 2014-07-12 LAB — MDC_IDC_ENUM_SESS_TYPE_INCLINIC
Battery Impedance: 225 Ohm
Battery Remaining Longevity: 89 mo
Battery Voltage: 2.79 V
Brady Statistic AP VP Percent: 97 %
Date Time Interrogation Session: 20160314161618
Lead Channel Impedance Value: 386 Ohm
Lead Channel Pacing Threshold Amplitude: 0.5 V
Lead Channel Pacing Threshold Pulse Width: 0.4 ms
Lead Channel Pacing Threshold Pulse Width: 0.4 ms
Lead Channel Setting Pacing Amplitude: 2.5 V
Lead Channel Setting Pacing Amplitude: 2.5 V
Lead Channel Setting Pacing Pulse Width: 0.4 ms
MDC IDC MSMT LEADCHNL RV IMPEDANCE VALUE: 384 Ohm
MDC IDC MSMT LEADCHNL RV PACING THRESHOLD AMPLITUDE: 0.75 V
MDC IDC SET LEADCHNL RV SENSING SENSITIVITY: 2.8 mV
MDC IDC STAT BRADY AP VS PERCENT: 0 %
MDC IDC STAT BRADY AS VP PERCENT: 0 %
MDC IDC STAT BRADY AS VS PERCENT: 3 %

## 2014-07-12 NOTE — Patient Instructions (Signed)
Your physician wants you to follow-up in: 12 months with Amber Seiler, NP You will receive a reminder letter in the mail two months in advance. If you don't receive a letter, please call our office to schedule the follow-up appointment.  Remote monitoring is used to monitor your Pacemaker or ICD from home. This monitoring reduces the number of office visits required to check your device to one time per year. It allows us to keep an eye on the functioning of your device to ensure it is working properly. You are scheduled for a device check from home on 10/11/14. You may send your transmission at any time that day. If you have a wireless device, the transmission will be sent automatically. After your physician reviews your transmission, you will receive a postcard with your next transmission date.    

## 2014-07-12 NOTE — Progress Notes (Signed)
[name] is a 81 y.o. female who presents today for routine electrophysiology followup.  Since her last visit, the patient reports doing very well.  Today, she denies symptoms of palpitations, chest pain, shortness of breath,  lower extremity edema, dizziness, presyncope, or syncope.  The patient is otherwise without complaint today.   Past Medical History  Diagnosis Date  . HTN (hypertension)   . Sick sinus syndrome     s/p PPM, PPM generator change 01/2004 and 12/13  . Dementia     h/o aggressive behavior  . Chronic renal insufficiency   . PVD (peripheral vascular disease)   . Normocytic anemia    Past Surgical History  Procedure Laterality Date  . Pacemaker insertion  07/31/1991  . Pacemaker generator change  04/15/12    MDT Sensia generator change by Dr Johney FrameAllred  . Permanent pacemaker generator change N/A 04/15/2012    Procedure: PERMANENT PACEMAKER GENERATOR CHANGE;  Surgeon: Hillis RangeJames Nyomi Howser, MD;  [facility];  Service: Cardiovascular;  Laterality: N/A;    Current Outpatient Prescriptions  Medication Sig Dispense Refill  . amLODipine (NORVASC) 10 MG tablet Take 1 tablet by mouth daily.    Marland Kitchen. aspirin 81 MG tablet Take 81 mg by mouth daily.    . Calcium Carbonate-Vitamin D (OS-CAL 500 + D PO) Take 1 tablet by mouth daily.    . cloNIDine (CATAPRES - DOSED IN MG/24 HR) 0.1 mg/24hr patch Place 0.1 mg onto the skin once a week. Remove old patch and rotate sites.    . divalproex (DEPAKOTE SPRINKLE) 125 MG capsule Takes 2 capsules =250 mg by mouth daily at 1200 to stabilize mood Then take 3 capsules = 375 mg by mouth every evening to stabilize mood    . EXELON 9.5 MG/24HR Apply 1 patch topically daily. Rotate sites    . hydrALAZINE (APRESOLINE) 25 MG tablet Take 25 mg by mouth 3 (three) times daily.    Marland Kitchen. lactulose, encephalopathy, (ENULOSE) 10 GM/15ML SOLN Take 30 g by mouth 2 (two) times daily.     Marland Kitchen. LORazepam (ATIVAN) 2 MG/ML concentrated solution Apply 1 mg topically every  morning for anxiety.    . metoprolol (LOPRESSOR) 100 MG tablet Take 1 tablet by mouth 2 (two) times daily. **HOLD IF PULSE LESS THAN 50 BPM**    . morphine (MSIR) 15 MG tablet Take one tablet by mouth daily for pain 30 tablet 0  . Multiple Vitamins-Minerals (CERTAGEN PO) Take 1 tablet by mouth daily.    Marland Kitchen. NAMENDA 5 MG tablet Take 1 tablet by mouth 2 (two) times daily.     . polyethylene glycol (MIRALAX / GLYCOLAX) packet Take 1/2 capful by mouth on Monday, Wednesday, and Friday    . protein supplement (RESOURCE BENEPROTEIN) POWD Take 2 scoop by mouth 2 (two) times daily.    . risperiDONE (RISPERDAL) 0.5 MG tablet Take 0.5 mg by mouth daily.    . sennosides-docusate sodium (SENOKOT-S) 8.6-50 MG tablet Take 2 tablets by mouth at bedtime.    . sertraline (ZOLOFT) 100 MG tablet Take 1 tablet by mouth daily. For depression.     No current facility-administered medications for this visit.    Physical Exam: Filed Vitals:   07/12/14 1514  BP: 130/78  Pulse: 62  Height: 5\' 3"  (1.6 m)    GEN- The patient is chronically ill appearing, alert and oriented x 3 today.   Head- normocephalic, atraumatic Eyes-  Sclera clear, conjunctiva pink Ears- hearing is very poor Oropharynx- clear Lungs-  Clear to ausculation bilaterally, normal work of breathing Chest- pacemaker pocket is well healed Heart- Regular rate and rhythm, no murmurs, rubs or gallops, PMI not laterally displaced GI- soft, NT, ND, + BS Extremities- no clubbing, cyanosis, or edema  Pacemaker interrogation- reviewed in detail today,  See PACEART report  Assessment and Plan:  1. Sick sinus syndrome/ complete heart block Normal pacemaker function See Pace Art report No changes today  2. HTN Stable No change required today  Enroll in remote monitoring at health care facility Return to see EP NP in 1 year

## 2014-07-20 ENCOUNTER — Encounter: Payer: Self-pay | Admitting: Internal Medicine

## 2014-08-04 ENCOUNTER — Encounter (HOSPITAL_BASED_OUTPATIENT_CLINIC_OR_DEPARTMENT_OTHER): Payer: Medicare Other | Attending: Surgery

## 2014-08-04 DIAGNOSIS — I10 Essential (primary) hypertension: Secondary | ICD-10-CM | POA: Diagnosis not present

## 2014-08-04 DIAGNOSIS — L89522 Pressure ulcer of left ankle, stage 2: Secondary | ICD-10-CM | POA: Insufficient documentation

## 2014-08-04 DIAGNOSIS — N189 Chronic kidney disease, unspecified: Secondary | ICD-10-CM | POA: Insufficient documentation

## 2014-08-04 DIAGNOSIS — L89622 Pressure ulcer of left heel, stage 2: Secondary | ICD-10-CM | POA: Diagnosis not present

## 2014-08-04 DIAGNOSIS — F0390 Unspecified dementia without behavioral disturbance: Secondary | ICD-10-CM | POA: Insufficient documentation

## 2014-08-04 DIAGNOSIS — I739 Peripheral vascular disease, unspecified: Secondary | ICD-10-CM | POA: Diagnosis not present

## 2014-08-04 DIAGNOSIS — Z950 Presence of cardiac pacemaker: Secondary | ICD-10-CM | POA: Diagnosis not present

## 2014-08-11 DIAGNOSIS — I739 Peripheral vascular disease, unspecified: Secondary | ICD-10-CM | POA: Diagnosis not present

## 2014-08-18 DIAGNOSIS — I739 Peripheral vascular disease, unspecified: Secondary | ICD-10-CM | POA: Diagnosis not present

## 2014-08-25 DIAGNOSIS — I739 Peripheral vascular disease, unspecified: Secondary | ICD-10-CM | POA: Diagnosis not present

## 2014-09-01 ENCOUNTER — Encounter (HOSPITAL_BASED_OUTPATIENT_CLINIC_OR_DEPARTMENT_OTHER): Payer: Medicare Other | Attending: Surgery

## 2014-09-01 DIAGNOSIS — L89622 Pressure ulcer of left heel, stage 2: Secondary | ICD-10-CM | POA: Diagnosis present

## 2014-09-01 DIAGNOSIS — I2510 Atherosclerotic heart disease of native coronary artery without angina pectoris: Secondary | ICD-10-CM | POA: Insufficient documentation

## 2014-09-01 DIAGNOSIS — M1990 Unspecified osteoarthritis, unspecified site: Secondary | ICD-10-CM | POA: Insufficient documentation

## 2014-09-01 DIAGNOSIS — I739 Peripheral vascular disease, unspecified: Secondary | ICD-10-CM | POA: Diagnosis not present

## 2014-09-01 DIAGNOSIS — F0390 Unspecified dementia without behavioral disturbance: Secondary | ICD-10-CM | POA: Diagnosis not present

## 2014-09-08 DIAGNOSIS — I739 Peripheral vascular disease, unspecified: Secondary | ICD-10-CM | POA: Diagnosis not present

## 2014-09-15 DIAGNOSIS — I739 Peripheral vascular disease, unspecified: Secondary | ICD-10-CM | POA: Diagnosis not present

## 2014-09-29 ENCOUNTER — Encounter (HOSPITAL_BASED_OUTPATIENT_CLINIC_OR_DEPARTMENT_OTHER): Payer: Medicare Other | Attending: Surgery

## 2014-09-29 DIAGNOSIS — F0391 Unspecified dementia with behavioral disturbance: Secondary | ICD-10-CM | POA: Diagnosis not present

## 2014-09-29 DIAGNOSIS — I10 Essential (primary) hypertension: Secondary | ICD-10-CM | POA: Insufficient documentation

## 2014-09-29 DIAGNOSIS — M1990 Unspecified osteoarthritis, unspecified site: Secondary | ICD-10-CM | POA: Insufficient documentation

## 2014-09-29 DIAGNOSIS — H269 Unspecified cataract: Secondary | ICD-10-CM | POA: Diagnosis not present

## 2014-09-29 DIAGNOSIS — I2510 Atherosclerotic heart disease of native coronary artery without angina pectoris: Secondary | ICD-10-CM | POA: Insufficient documentation

## 2014-09-29 DIAGNOSIS — Z950 Presence of cardiac pacemaker: Secondary | ICD-10-CM | POA: Insufficient documentation

## 2014-09-29 DIAGNOSIS — I739 Peripheral vascular disease, unspecified: Secondary | ICD-10-CM | POA: Insufficient documentation

## 2014-09-29 DIAGNOSIS — H409 Unspecified glaucoma: Secondary | ICD-10-CM | POA: Diagnosis not present

## 2014-09-29 DIAGNOSIS — D649 Anemia, unspecified: Secondary | ICD-10-CM | POA: Diagnosis not present

## 2014-09-29 DIAGNOSIS — N289 Disorder of kidney and ureter, unspecified: Secondary | ICD-10-CM | POA: Diagnosis not present

## 2014-09-29 DIAGNOSIS — L89622 Pressure ulcer of left heel, stage 2: Secondary | ICD-10-CM | POA: Insufficient documentation

## 2014-10-11 ENCOUNTER — Encounter: Payer: Medicare Other | Admitting: *Deleted

## 2014-10-11 ENCOUNTER — Telehealth: Payer: Self-pay | Admitting: Cardiology

## 2014-10-11 NOTE — Telephone Encounter (Signed)
Attempted to confirm remote transmission with pt. No answer and was unable to leave a message.   

## 2014-10-12 ENCOUNTER — Encounter: Payer: Self-pay | Admitting: Cardiology

## 2014-10-29 DEATH — deceased
